# Patient Record
Sex: Female | Born: 1965 | Race: Black or African American | Hispanic: No | Marital: Single | Smoking: Never smoker
Health system: Southern US, Community
[De-identification: ages and names within clinical notes are randomized; demographics above are authoritative.]

## PROBLEM LIST (undated history)

## (undated) DIAGNOSIS — F3130 Bipolar disorder, current episode depressed, mild or moderate severity, unspecified: Secondary | ICD-10-CM

## (undated) DIAGNOSIS — R569 Unspecified convulsions: Secondary | ICD-10-CM

## (undated) DIAGNOSIS — F329 Major depressive disorder, single episode, unspecified: Secondary | ICD-10-CM

## (undated) DIAGNOSIS — F32A Depression, unspecified: Secondary | ICD-10-CM

## (undated) DIAGNOSIS — K649 Unspecified hemorrhoids: Secondary | ICD-10-CM

## (undated) DIAGNOSIS — D649 Anemia, unspecified: Secondary | ICD-10-CM

## (undated) DIAGNOSIS — T7840XA Allergy, unspecified, initial encounter: Secondary | ICD-10-CM

## (undated) DIAGNOSIS — N189 Chronic kidney disease, unspecified: Secondary | ICD-10-CM

## (undated) DIAGNOSIS — K589 Irritable bowel syndrome without diarrhea: Secondary | ICD-10-CM

## (undated) HISTORY — PX: COLONOSCOPY: SHX174

## (undated) HISTORY — PX: WISDOM TOOTH EXTRACTION: SHX21

## (undated) HISTORY — DX: Unspecified hemorrhoids: K64.9

## (undated) HISTORY — DX: Allergy, unspecified, initial encounter: T78.40XA

## (undated) HISTORY — PX: ROOT CANAL: SHX2363

---
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
## 2006-06-05 DIAGNOSIS — R569 Unspecified convulsions: Secondary | ICD-10-CM

## 2006-06-05 HISTORY — DX: Unspecified convulsions: R56.9

## 2013-11-06 ENCOUNTER — Encounter (HOSPITAL_COMMUNITY): Payer: Self-pay | Admitting: Emergency Medicine

## 2013-11-06 ENCOUNTER — Emergency Department (HOSPITAL_COMMUNITY)
Admission: EM | Admit: 2013-11-06 | Discharge: 2013-11-07 | Disposition: A | Discharge status: Other Acute Inpatient Hospital | Payer: BC Managed Care – PPO | Attending: Dermatology | Admitting: Dermatology

## 2013-11-06 DIAGNOSIS — Z79899 Other long term (current) drug therapy: Secondary | ICD-10-CM | POA: Insufficient documentation

## 2013-11-06 DIAGNOSIS — F319 Bipolar disorder, unspecified: Secondary | ICD-10-CM

## 2013-11-06 DIAGNOSIS — R45851 Suicidal ideations: Secondary | ICD-10-CM | POA: Insufficient documentation

## 2013-11-06 DIAGNOSIS — Z8719 Personal history of other diseases of the digestive system: Secondary | ICD-10-CM | POA: Insufficient documentation

## 2013-11-06 DIAGNOSIS — F3130 Bipolar disorder, current episode depressed, mild or moderate severity, unspecified: Secondary | ICD-10-CM | POA: Insufficient documentation

## 2013-11-06 DIAGNOSIS — Z862 Personal history of diseases of the blood and blood-forming organs and certain disorders involving the immune mechanism: Secondary | ICD-10-CM | POA: Insufficient documentation

## 2013-11-06 HISTORY — DX: Major depressive disorder, single episode, unspecified: F32.9

## 2013-11-06 HISTORY — DX: Depression, unspecified: F32.A

## 2013-11-06 HISTORY — DX: Anemia, unspecified: D64.9

## 2013-11-06 HISTORY — DX: Bipolar disorder, current episode depressed, mild or moderate severity, unspecified: F31.30

## 2013-11-06 HISTORY — DX: Irritable bowel syndrome, unspecified: K58.9

## 2013-11-06 LAB — CBC
HEMATOCRIT: 37.2 % (ref 36.0–46.0)
Hemoglobin: 12.1 g/dL (ref 12.0–15.0)
MCH: 26.6 pg (ref 26.0–34.0)
MCHC: 32.5 g/dL (ref 30.0–36.0)
MCV: 81.8 fL (ref 78.0–100.0)
PLATELETS: 286 10*3/uL (ref 150–400)
RBC: 4.55 MIL/uL (ref 3.87–5.11)
RDW: 12.9 % (ref 11.5–15.5)
WBC: 4.8 10*3/uL (ref 4.0–10.5)

## 2013-11-06 LAB — ETHANOL: Alcohol, Ethyl (B): <11 mg/dL (ref 0–11)

## 2013-11-06 LAB — COMPREHENSIVE METABOLIC PANEL
ALT: 10 U/L (ref 0–35)
AST: 15 U/L (ref 0–37)
Albumin: 4.2 g/dL (ref 3.5–5.2)
Alkaline Phosphatase: 116 U/L (ref 39–117)
BILIRUBIN TOTAL: 0.7 mg/dL (ref 0.3–1.2)
BUN: 11 mg/dL (ref 6–23)
CALCIUM: 10.4 mg/dL (ref 8.4–10.5)
CHLORIDE: 107 meq/L (ref 96–112)
CO2: 25 mEq/L (ref 19–32)
CREATININE: 0.75 mg/dL (ref 0.50–1.10)
GFR calc Af Amer: >90 mL/min (ref >90)
GFR calc non Af Amer: >90 mL/min (ref >90)
Glucose, Bld: 100 mg/dL — ABNORMAL HIGH (ref 70–99)
Potassium: 3.5 mEq/L — ABNORMAL LOW (ref 3.7–5.3)
Sodium: 144 mEq/L (ref 137–147)
Total Protein: 8.3 g/dL (ref 6.0–8.3)

## 2013-11-06 LAB — RAPID URINE DRUG SCREEN, HOSP PERFORMED
Amphetamines: NOT DETECTED
BARBITURATES: NOT DETECTED
BENZODIAZEPINES: NOT DETECTED
COCAINE: NOT DETECTED
OPIATES: NOT DETECTED
Tetrahydrocannabinol: NOT DETECTED

## 2013-11-06 LAB — LITHIUM LEVEL: Lithium Lvl: <0.25 mEq/L — ABNORMAL LOW (ref 0.80–1.40)

## 2013-11-06 LAB — ACETAMINOPHEN LEVEL

## 2013-11-06 LAB — SALICYLATE LEVEL: Salicylate Lvl: <2 mg/dL — ABNORMAL LOW (ref 2.8–20.0)

## 2013-11-06 MED ORDER — LAMOTRIGINE 200 MG PO TABS
200.0000 mg | ORAL_TABLET | Freq: Every day | ORAL | Status: DC
  Administered 2013-11-06: 200 mg via ORAL
  Filled 2013-11-06 (×2): qty 1

## 2013-11-06 MED ORDER — TRAZODONE HCL 50 MG PO TABS
50.0000 mg | ORAL_TABLET | Freq: Every day | ORAL | Status: DC
  Administered 2013-11-06: 50 mg via ORAL
  Filled 2013-11-06: qty 1

## 2013-11-06 MED ORDER — LITHIUM CARBONATE 300 MG PO CAPS
450.0000 mg | ORAL_CAPSULE | Freq: Two times a day (BID) | ORAL | Status: DC
  Administered 2013-11-06 – 2013-11-07 (×2): 450 mg via ORAL
  Filled 2013-11-06 (×4): qty 1

## 2013-11-06 MED ORDER — LITHIUM CARBONATE 300 MG PO CAPS: 900.0000 mg | ORAL_CAPSULE | Freq: Every day | ORAL | Status: DC

## 2013-11-06 MED ORDER — IBUPROFEN 200 MG PO TABS: 600.0000 mg | ORAL_TABLET | Freq: Three times a day (TID) | ORAL | Status: DC | PRN

## 2013-11-06 MED ORDER — VENLAFAXINE HCL ER 150 MG PO CP24
150.0000 mg | ORAL_CAPSULE | Freq: Every day | ORAL | Status: DC
  Administered 2013-11-07: 150 mg via ORAL
  Filled 2013-11-06 (×2): qty 1

## 2013-11-06 MED ORDER — ACETAMINOPHEN 325 MG PO TABS
650.0000 mg | ORAL_TABLET | ORAL | Status: DC | PRN
  Administered 2013-11-06 – 2013-11-07 (×2): 650 mg via ORAL
  Filled 2013-11-06 (×2): qty 2

## 2013-11-06 NOTE — Progress Notes (Signed)
  CARE MANAGEMENT ED NOTE 11/06/2013  Patient:  Carmen Kim, Carmen Kim   Account Number:  [id number]  Date Initiated:  11/06/2013  Documentation initiated by:  Jackelyn Poling  Subjective/Objective Assessment:   48 yr old  [facility] [place] ppo covered pt reports she is homeless in [place] with only a friend listed as emergency contact c/o mental crisis bipolar, thoughts of hurting herself, PMH bipolar affect, depressed     Subjective/Objective Assessment Detail:   Pt confirms she has no pcp Has [employer] [facility] [place] ppo insurance coverage and has only seen a Tour manager Reports " I do have some medical problems that need to be taken care of also."  Pt agreeable to a list of in network [facility] [place] ppo providers  Very appreciative of resources offered     Action/Plan:   ED Cm spoke with pt m discussed f/u care with a pcp Discussed how to obtain further providers as needed via website or customer services Provided with a list of [facility] pcps & psychiatrists within zip [phone]   Action/Plan Detail:   Encouraged f/u   Anticipated DC Date:  11/06/2013     Status Recommendation to Physician:   Result of Recommendation:    Other ED [place]  Other  PCP issues    Choice offered to / List presented to:            Status of service:  Completed, signed off  ED Comments:   ED Comments Detail:

## 2013-11-06 NOTE — Progress Notes (Signed)
CSW received a call from [place] with [facility] accepting this patient per Dr. Abbey Chatters. Patient can arrive anytime after 9am 11/07/2013.  Report# [id number].  CSW informed the nursing staff of the update in disposition.      Chesley Noon, MSW, [place], 11/06/2013 Evening Clinical Social Worker [phone]

## 2013-11-06 NOTE — BH Assessment (Signed)
Assessment Note  Carmen Kim is an 48 y.o. female. Patient was brought into the ED by school faculty because of suicidal ideation with plan to overdose.  Patient reports that she is currently unable to contract for safety.  Patient reports last week taking 15 trazodone and Lamictal attempting to overdose and tying a plastic bag over her head then falling asleep to suffocate. Patient reports the result of her the previously mention attempt was that she awoke with the bag torn.  Patient reports 3 other suicide attempts in the past but the last was 10 years ago.  She reports a family history of mental health diagnosis from schizophrenia,  bipolar disorder and substance abuse.  Patient denies homicidal ideations, hallucination, other self injurious behaviors, and substance abuse.   Patient reports current depression symptoms includes hopelessness, worthlessness, overwhelmed, increased anxiety, irritability, increased sleep, and poor eating habits.    CSW ran patient with Manus Gunning, NP and Dr. Ashok Cordia it is recommended refer for inpatient treatment for safety and stabilization. CSW spoke with Randall Hiss, [facility] as he reports no 500 hall beds at this time.  The patient will need to be referred to outside facilities.    Axis I: Bipolar, Depressed Axis II: Deferred Axis III:  Past Medical History  Diagnosis Date  . Bipolar affect, depressed   . Depressed   . IBS (irritable bowel syndrome)   . Anemia    Axis IV: economic problems, educational problems, housing problems, occupational problems, other psychosocial or environmental problems, problems related to social environment, problems with access to health care services and problems with primary support group Axis V: 11-20 some danger of hurting self or others possible OR occasionally fails to maintain minimal personal hygiene OR gross impairment in communication  Past Medical History:  Past Medical History  Diagnosis Date  . Bipolar affect, depressed   .  Depressed   . IBS (irritable bowel syndrome)   . Anemia     History reviewed. No pertinent past surgical history.  Family History: History reviewed. No pertinent family history.  Social History:  reports that she has never smoked. She does not have any smokeless tobacco history on file. She reports that she does not drink alcohol or use illicit drugs.  Additional Social History:     CIWA: CIWA-Ar BP: 137/90 mmHg Pulse Rate: 69 COWS:    Allergies:  Allergies  Allergen Reactions  . Wellbutrin [Bupropion] Other (See Comments)    seizures    Home Medications:  (Not in a hospital admission)  OB/GYN Status:  Patient's last menstrual period was 09/06/2013.  General Assessment Data Location of Assessment: [facility] ACT Assessment: Yes Is this a Tele or Face-to-Face Assessment?: Face-to-Face Is this an Initial Assessment or a Re-assessment for this encounter?: Initial Assessment Living Arrangements: Alone Can pt return to current living arrangement?: Yes Admission Status: Voluntary Is patient capable of signing voluntary admission?: Yes Transfer from: Home Referral Source: Self/Family/Friend  Medical Screening Exam ([place]) Medical Exam completed: Yes  [place] Living Arrangements: Alone Name of Psychiatrist: [facility] Name of Therapist: [employer]  Education Status Is patient currently in school?: Yes Current Grade: Graduate School Highest grade of school patient has completed: Undergrad Name of school: [employer]  Risk to self Suicidal Ideation: Yes-Currently Present Suicidal Intent: Yes-Currently Present Is patient at risk for suicide?: Yes Suicidal Plan?: Yes-Currently Present Specify Current Suicidal Plan: overdose Access to Means: Yes Specify Access to Suicidal Means: personal medication What has been your use of drugs/alcohol  within the last 12 months?: none Previous Attempts/Gestures: Yes How many times?: 4 Other Self Harm Risks: none Triggers for  Past Attempts: Unpredictable;Other (Comment) (financial instability) Intentional Self Injurious Behavior: None Family Suicide History: No Recent stressful life event(s): Loss (Comment);Financial Problems Persecutory voices/beliefs?: No Depression: Yes Depression Symptoms: Despondent;Tearfulness;Isolating;Guilt;Loss of interest in usual pleasures;Feeling worthless/self pity;Feeling angry/irritable Substance abuse history and/or treatment for substance abuse?: No  Risk to Others Homicidal Ideation: No-Not Currently/Within Last 6 Months Thoughts of Harm to Others: No-Not Currently Present/Within Last 6 Months Current Homicidal Intent: No-Not Currently/Within Last 6 Months Current Homicidal Plan: No-Not Currently/Within Last 6 Months Access to Homicidal Means: No Identified Victim: noen History of harm to others?: No Assessment of Violence: None Noted Does patient have access to weapons?: No Criminal Charges Pending?: No Does patient have a court date: No  Psychosis Hallucinations: None noted Delusions: None noted  Mental Status Report Appear/Hygiene: In hospital gown Eye Contact: Good Motor Activity: Freedom of movement Speech: Pressured;Soft;Logical/coherent Level of Consciousness: Alert Mood: Depressed;Guilty;Anhedonia;Irritable;Worthless, low self-esteem;Sad (hopelessness) Affect: Depressed;Irritable;Sad;Flat Anxiety Level: Moderate Thought Processes: Coherent Judgement: Unimpaired Orientation: Person;Place;Time;Appropriate for developmental age;Situation Obsessive Compulsive Thoughts/Behaviors: None  Cognitive Functioning Concentration: Decreased Memory: Remote Intact;Recent Intact IQ: Average Insight: Fair Impulse Control: Poor Appetite: Fair Sleep: Increased Total Hours of Sleep: 16 Vegetative Symptoms: None  ADLScreening [facility] Assessment Services) Patient's cognitive ability adequate to safely complete daily activities?: Yes Patient able to express need for  assistance with ADLs?: Yes Independently performs ADLs?: Yes (appropriate for developmental age)  Prior Inpatient Therapy Prior Inpatient Therapy: Yes Prior Therapy Dates: 1989 Prior Therapy Facilty/Provider(s): [facility] Reason for Treatment: SI  Prior Outpatient Therapy Prior Outpatient Therapy: Yes Prior Therapy Dates: 2015 Prior Therapy Facilty/Provider(s): [employer] Reason for Treatment: Bipolar 2  ADL Screening (condition at time of admission) Patient's cognitive ability adequate to safely complete daily activities?: Yes Patient able to express need for assistance with ADLs?: Yes Independently performs ADLs?: Yes (appropriate for developmental age)         Values / Beliefs Cultural Requests During Hospitalization: None Spiritual Requests During Hospitalization: None        Additional Information 1:1 In Past 12 Months?: No CIRT Risk: No Elopement Risk: No Does patient have medical clearance?: Yes     Disposition:  Disposition Initial Assessment Completed for this Encounter: Yes Disposition of Patient: Inpatient treatment program Type of inpatient treatment program: Adult  On Site Evaluation by:   Reviewed with Physician:    Edmund Hilda 11/06/2013 7:37 PM

## 2013-11-06 NOTE — Progress Notes (Signed)
Attempted placement at the following facilities:  [facility]- left message [facility]- no beds [facility] only on geri beds [facility]- only geri female beds but faxed information for potential discharges    Chesley Noon, MSW, [place], 11/06/2013 Evening Clinical Social Worker [phone]

## 2013-11-06 NOTE — ED Notes (Signed)
Patient endorses SI. Denies HI, AVH. Contracts for safety on the unit. Rates current feelings of anxiety 8/10, depression 10/10. Reports headache. Patient is calm, cooperative.  Encouragement offered. Given Tylenol. Patient able to give urine sample.  Patient safety maintained, Q 15 checks continue.

## 2013-11-06 NOTE — ED Notes (Signed)
Pt has hx of bipolar.  Sees psychiatrist for med consults at [facility].  Pt takes trazadone and lamictal.  States she is not doing well.  Pt is having thoughts of hurting self.  Attempted last week to take extra meds and  to use plastic bag over her head.  Pt woke up with bag off her head.  Pt states new plan would be to hang herself.  Pt lives alone.  Recently had multiple moves d/t financial changes.  No resources or assistance close by.  Pt plans to move out of state were changed.  Pt moved out of dorm to extended stay.  Money got too tight and she moved in to a cheaper motel.  Has been pawning belongings to get by.  At the end of the rope in that she has no money and no place to go.  Plan formed to kill herself today during last night available at hotel.

## 2013-11-06 NOTE — ED Provider Notes (Signed)
CSN: [id number]     Arrival date & time 11/06/13  1424 History   First MD Initiated Contact with Patient 11/06/13 1503     Chief Complaint  Patient presents with  . Medical Clearance     (Consider location/radiation/quality/duration/timing/severity/associated sxs/prior Treatment) The history is provided by the patient.  pt with hx bipolar disorder c/o feeling very depressed in past 2 weeks. Is graduate student at [employer], states several stressors recently including incomplete assignments, problems w student aid, summer internship falling through, financial challenges.  Pt states also off her meds for past few weeks, states stopped taking as they didn't help anyway. Trouble sleeping at night, decreased appetite. +suicidal thoughts w plan.  Denies attempt to harm self, denies od. States physical health at baseline. Denies any problems w etoh or substance abuse.      Past Medical History  Diagnosis Date  . Bipolar affect, depressed   . Depressed   . IBS (irritable bowel syndrome)   . Anemia    History reviewed. No pertinent past surgical history. History reviewed. No pertinent family history. History  Substance Use Topics  . Smoking status: Never Smoker   . Smokeless tobacco: Not on file  . Alcohol Use: No   OB History   Grav Para Term Preterm Abortions TAB SAB Ect Mult Living                 Review of Systems  Constitutional: Negative for fever and chills.  HENT: Negative for sore throat.   Eyes: Negative for redness.  Respiratory: Negative for shortness of breath.   Cardiovascular: Negative for chest pain.  Gastrointestinal: Negative for vomiting and abdominal pain.  Genitourinary: Negative for flank pain.  Musculoskeletal: Negative for back pain and neck pain.  Skin: Negative for rash.  Neurological: Negative for headaches.  Hematological: Does not bruise/bleed easily.  Psychiatric/Behavioral: Positive for suicidal ideas and dysphoric mood.      Allergies   Wellbutrin  Home Medications   Prior to Admission medications   Medication Sig Start Date End Date Taking? Authorizing Provider  dicyclomine (BENTYL) 10 MG capsule Take 10 mg by mouth 4 (four) times daily as needed for spasms.   Yes Historical Provider, MD  lamoTRIgine (LAMICTAL) 100 MG tablet Take 200 mg by mouth daily.   Yes Historical Provider, MD  lithium carbonate 300 MG capsule Take 900 mg by mouth at bedtime.   Yes Historical Provider, MD  traZODone (DESYREL) 50 MG tablet Take 50 mg by mouth at bedtime.   Yes Historical Provider, MD  venlafaxine XR (EFFEXOR-XR) 150 MG 24 hr capsule Take 150 mg by mouth daily with breakfast.   Yes Historical Provider, MD   BP 123/74  Pulse 81  Temp(Src) 98.2 F (36.8 C) (Oral)  Resp 20  SpO2 100%  LMP 09/06/2013 Physical Exam  Nursing note and vitals reviewed. Constitutional: She is oriented to person, place, and time. She appears well-developed and well-nourished. No distress.  HENT:  Head: Atraumatic.  Eyes: Conjunctivae are normal. No scleral icterus.  Neck: Neck supple. No tracheal deviation present.  Cardiovascular: Normal rate, regular rhythm, normal heart sounds and intact distal pulses.   Pulmonary/Chest: Effort normal and breath sounds normal. No respiratory distress.  Abdominal: Soft. Normal appearance and bowel sounds are normal. She exhibits no distension. There is no tenderness.  Musculoskeletal: She exhibits no edema and no tenderness.  Neurological: She is alert and oriented to person, place, and time.  Skin: Skin is warm and dry. No rash  noted.  Psychiatric:  Depressed mood, +SI.     ED Course  Procedures (including critical care time) Labs Review Results for orders placed during the hospital encounter of 11/06/13  CBC      Result Value Ref Range   WBC 4.8  4.0 - 10.5 K/uL   RBC 4.55  3.87 - 5.11 MIL/uL   Hemoglobin 12.1  12.0 - 15.0 g/dL   HCT 37.2  36.0 - 46.0 %   MCV 81.8  78.0 - 100.0 fL   MCH 26.6  26.0 -  34.0 pg   MCHC 32.5  30.0 - 36.0 g/dL   RDW 12.9  11.5 - 15.5 %   Platelets 286  150 - 400 K/uL  COMPREHENSIVE METABOLIC PANEL      Result Value Ref Range   Sodium 144  137 - 147 mEq/L   Potassium 3.5 (*) 3.7 - 5.3 mEq/L   Chloride 107  96 - 112 mEq/L   CO2 25  19 - 32 mEq/L   Glucose, Bld 100 (*) 70 - 99 mg/dL   BUN 11  6 - 23 mg/dL   Creatinine, Ser 0.75  0.50 - 1.10 mg/dL   Calcium 10.4  8.4 - 10.5 mg/dL   Total Protein 8.3  6.0 - 8.3 g/dL   Albumin 4.2  3.5 - 5.2 g/dL   AST 15  0 - 37 U/L   ALT 10  0 - 35 U/L   Alkaline Phosphatase 116  39 - 117 U/L   Total Bilirubin 0.7  0.3 - 1.2 mg/dL   GFR calc non Af Amer >90  >90 mL/min   GFR calc Af Amer >90  >90 mL/min  URINE RAPID DRUG SCREEN (HOSP PERFORMED)      Result Value Ref Range   Opiates NONE DETECTED  NONE DETECTED   Cocaine NONE DETECTED  NONE DETECTED   Benzodiazepines NONE DETECTED  NONE DETECTED   Amphetamines NONE DETECTED  NONE DETECTED   Tetrahydrocannabinol NONE DETECTED  NONE DETECTED   Barbiturates NONE DETECTED  NONE DETECTED  ETHANOL      Result Value Ref Range   Alcohol, Ethyl (B) <11  0 - 11 mg/dL  LITHIUM LEVEL      Result Value Ref Range   Lithium Lvl <0.25 (*) 0.80 - 1.40 mEq/L  ACETAMINOPHEN LEVEL      Result Value Ref Range   Acetaminophen (Tylenol), Serum <15.0  10 - 30 ug/mL  SALICYLATE LEVEL      Result Value Ref Range   Salicylate Lvl <9.3 (*) 2.8 - 20.0 mg/dL       MDM  Labs.  Psych team consulted.  Reviewed nursing notes and prior charts for additional history.   Disposition per psych team - pt to require inpatient psych treatment.      Mirna Mires, MD 11/08/13 [phone]

## 2014-09-17 ENCOUNTER — Encounter (HOSPITAL_COMMUNITY): Payer: Self-pay | Admitting: Emergency Medicine

## 2014-09-17 ENCOUNTER — Emergency Department (INDEPENDENT_AMBULATORY_CARE_PROVIDER_SITE_OTHER)
Admission: EM | Admit: 2014-09-17 | Discharge: 2014-09-17 | Disposition: A | Discharge status: Home / Self Care | Payer: Self-pay | Source: Home / Self Care | Attending: Family Medicine | Admitting: Family Medicine

## 2014-09-17 DIAGNOSIS — R21 Rash and other nonspecific skin eruption: Secondary | ICD-10-CM

## 2014-09-17 MED ORDER — PREDNISONE 10 MG (48) PO TBPK: ORAL_TABLET | Freq: Every day | ORAL | Status: DC

## 2014-09-17 MED ORDER — FLUCONAZOLE 150 MG PO TABS: 150.0000 mg | ORAL_TABLET | Freq: Every day | ORAL | Status: DC

## 2014-09-17 MED ORDER — CEPHALEXIN 500 MG PO CAPS: 500.0000 mg | ORAL_CAPSULE | Freq: Two times a day (BID) | ORAL | Status: DC

## 2014-09-17 NOTE — ED Notes (Signed)
Reports red blistery rash on right lower arm below wrist. On set 4/13.  Pt has tried apple sider and warm compresses with no relief.

## 2014-09-17 NOTE — Discharge Instructions (Signed)
Your rash appears to be contact dermatitis but there may be some underlying bacterial infection causing cellulitis. Please start with the steroids and take them for the full duration. Please start the antibiotics if the rash becomes more red and painful. Please use the Diflucan if you develop a yeast infection.

## 2014-09-17 NOTE — ED Provider Notes (Signed)
CSN: [id number]     Arrival date & time 09/17/14  1846 History   First MD Initiated Contact with Patient 09/17/14 1949     Chief Complaint  Patient presents with  . Rash   (Consider location/radiation/quality/duration/timing/severity/associated sxs/prior Treatment) HPI  Rash: started 1 day ago. Constant. Getting bigger. Itchy. Apple cyder vinegar and warm compress w/o benefit. Denies fevers, nausea, vomiting,, pain, headache, dysuria, frequency, back pain. No known trauma, denies any new soaps lotions detergents. Denies any tenderness on palpation. Denies any purulence.  Past Medical History  Diagnosis Date  . Bipolar affect, depressed   . Depressed   . IBS (irritable bowel syndrome)   . Anemia    History reviewed. No pertinent past surgical history. Family History  Problem Relation Age of Onset  . Diabetes Mother   . Hypertension Mother   . Cerebral aneurysm Mother    History  Substance Use Topics  . Smoking status: Never Smoker   . Smokeless tobacco: Not on file  . Alcohol Use: No   OB History    No data available     Review of Systems Per HPI with all other pertinent systems negative.   Allergies  Wellbutrin  Home Medications   Prior to Admission medications   Medication Sig Start Date End Date Taking? Authorizing Provider  cephALEXin (KEFLEX) 500 MG capsule Take 1 capsule (500 mg total) by mouth 2 (two) times daily. 09/17/14   Waldemar Dickens, MD  dicyclomine (BENTYL) 10 MG capsule Take 10 mg by mouth 4 (four) times daily as needed for spasms.    Historical Provider, MD  fluconazole (DIFLUCAN) 150 MG tablet Take 1 tablet (150 mg total) by mouth daily. Repeat dose in 3 days 09/17/14   Waldemar Dickens, MD  lamoTRIgine (LAMICTAL) 100 MG tablet Take 200 mg by mouth daily.    Historical Provider, MD  lithium carbonate 300 MG capsule Take 900 mg by mouth at bedtime.    Historical Provider, MD  predniSONE (STERAPRED UNI-PAK 48 TAB) 10 MG (48) TBPK tablet Take by mouth  daily. 09/17/14   Waldemar Dickens, MD  traZODone (DESYREL) 50 MG tablet Take 50 mg by mouth at bedtime.    Historical Provider, MD  venlafaxine XR (EFFEXOR-XR) 150 MG 24 hr capsule Take 150 mg by mouth daily with breakfast.    Historical Provider, MD   BP 125/90 mmHg  Pulse 80  Temp(Src) 98.1 F (36.7 C) (Oral)  SpO2 98% Physical Exam Physical Exam  Constitutional: oriented to person, place, and time. appears well-developed and well-nourished. No distress.  HENT:  Head: Normocephalic and atraumatic.  Eyes: EOMI. PERRL.  Neck: Normal range of motion.  Cardiovascular: RRR, no m/r/g, 2+ distal pulses,  Pulmonary/Chest: Effort normal and breath sounds normal. No respiratory distress.  Abdominal: Soft. Bowel sounds are normal. NonTTP, no distension.  Musculoskeletal: Normal range of motion. Non ttp, no effusion.  Neurological: alert and oriented to person, place, and time.  Skin: Right distal forearm with several erythematous macular lesions that are somewhat indurated, nontender to palpation, Psychiatric: normal mood and affect. behavior is normal. Judgment and thought content normal.   ED Course  Procedures (including critical care time) Labs Review Labs Reviewed - No data to display  Imaging Review No results found.   MDM   1. Rash    Etiology not immediately clear but concerning for possible contact dermatitis versus other allergic reaction versus autoimmune. Concern for possible beginnings of secondary bacterial infection causing cellulitis. Patient start  steroid Dosepak and will start Keflex if needed. Diflucan if develops [place] infection. Follow-up with PCP if rash returns or develops another areas for autoimmune workup.    Waldemar Dickens, MD 09/17/14 2018

## 2017-01-23 NOTE — Progress Notes (Addendum)
[place] at [facility] [address] [place], [place] [address] [phone]  Date:  01/25/2017   Name:  Carmen Kim   DOB:  24-Nov-1965   MRN:  [id number]  PCP:  Darreld Mclean, MD    Chief Complaint: Establish Care (Pt states she's been out of care for a few years. Wants to discuss getting labs, behavioral health and Rx managament as well as getting a mammo and AWV )   History of Present Illness:  Ileana Kim is a 51 y.o. 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.  In 2015 she had a "mental health breakdown," and was even homeless for a short period of time. Since then she has gotten back on her feet and is doing much better She just recently started seeing a therapist.    She is not on any medications right now but would like to start back on her meds for bipolar disorder She does not have a car- but has to rely on Lyft a lot of the time   She has been dx with bipolar disorder, does not have a psychiatrist right now  Her sleep is not that great - she is able to fall asleep, but will wake up easily a lot of the time She does snore, and is interested in getting a sleep study to eval for OSA  She is not fasting- she had some chicken and broccoli several hours ago.  She has been menopausal for about 3 years now   She has not history of CAD.  She is able to walk a long distance wihtout any CP or SOB  Will plan to taper her back on Lamictal and lithium She had been on  lithium ER 900 in the everning and lamictal 200 daily most recently and felt like she did well with this She does not want to go back on trazodone right now, and we will also not start her back on effexor   Right now she feels like her irritabilty is high, and she has some depression symptoms although not severe  No SI at this time  There are no active problems to display for this patient.   Past Medical History:  Diagnosis Date  . Anemia   . Bipolar affect, depressed ([place])   . Depressed   . IBS (irritable bowel syndrome)     No past surgical history on file.  Social History  Substance Use Topics  . Smoking status: Never Smoker  . Smokeless tobacco: Never Used  . Alcohol use 0.6 oz/week    1 Glasses of wine per week    Family History  Problem Relation Age of Onset  . Diabetes Mother   . Hypertension Mother   . Cerebral aneurysm Mother     Allergies  Allergen Reactions  . Wellbutrin [Bupropion] Other (See Comments)    seizures    Medication list has been reviewed and updated.  Current Outpatient  Prescriptions on File Prior to Visit  Medication Sig Dispense Refill  . dicyclomine (BENTYL) 10 MG capsule Take 10 mg by mouth 4 (four) times daily as needed for spasms.    Marland Kitchen lamoTRIgine (LAMICTAL) 100 MG tablet Take 200 mg by mouth daily.    Marland Kitchen lithium carbonate 300 MG capsule Take 900 mg by mouth at bedtime.    . traZODone (DESYREL) 50 MG tablet Take 50 mg by mouth at bedtime.    Marland Kitchen venlafaxine XR (EFFEXOR-XR) 150 MG 24 hr capsule Take 150 mg by mouth daily with breakfast.     No current facility-administered medications on file prior to visit.     Review of Systems:  As per HPI- otherwise negative.   Physical Examination: Vitals:   01/25/17 0834  BP: 126/88  Pulse: 66  Resp: 20  Temp: 98.3 F (36.8 C)  SpO2: 99%   Vitals:   01/25/17 0834  Weight: 213 lb (96.6 kg)  Height: 5\' 4"  (1.626 m)   Body mass index is 36.56 kg/m. Ideal Body Weight: Weight in  (lb) to have BMI = 25: 145.3  GEN: WDWN, NAD, Non-toxic, A & O x 3 HEENT: Atraumatic, Normocephalic. Neck supple. No masses, No LAD. Ears and Nose: No external deformity. CV: RRR, No M/G/R. No JVD. No thrill. No extra heart sounds. PULM: CTA B, no wheezes, crackles, rhonchi. No retractions. No resp. distress. No accessory muscle use. ABD: S, NT, ND, +BS. No rebound. No HSM. EXTR: No c/c/e NEURO Normal gait.  PSYCH: Normally interactive. Conversant. Not depressed or anxious appearing.  Calm demeanor.   EKG: SR.  She does have downgoing T wave in III only.  No acute ST changes. No old EKG for comparison Assessment and Plan: Medication monitoring encounter - Plan: Comprehensive metabolic panel, EKG 93-TDSK, POCT urinalysis dipstick  Screening for hyperlipidemia - Plan: Lipid panel  Screening for deficiency anemia - Plan: CBC  Vitamin D deficiency - Plan: Vitamin D (25 hydroxy)  Chronic fatigue - Plan: Ambulatory referral to Neurology, TSH, Vitamin D (25 hydroxy)  Snoring - Plan: Ambulatory referral to Neurology  Screening for diabetes mellitus - Plan: Lipid panel, Hemoglobin A1c  Bipolar 2 disorder ([place]) - Plan: lamoTRIgine (LAMICTAL) 25 MG CHEW chewable tablet, lithium carbonate (LITHOBID) 300 MG CR tablet  Here today to establish care Labs pending as above She would like a sleep study- will set up for her EKG today is overall reassuring - she has no history of CAD or CP It will be challenging to find her a psychiatrist, so I will start getting back on her meds for bipolar disorder while she works on finding a psychiatrist. Assuming her labs are ok will plan to have her titrate back on lamictal and lithium with close monitoring planned.  I did rx these meds but she will not pick them up until we speak in case of any problems on her labs  Signed Lamar Blinks, MD  8/24 Called pt and [facility]- her labs look good so far.  Will call her to discuss her medication further

## 2017-01-25 ENCOUNTER — Ambulatory Visit (INDEPENDENT_AMBULATORY_CARE_PROVIDER_SITE_OTHER): Payer: Managed Care, Other (non HMO) | Admitting: Family Medicine

## 2017-01-25 ENCOUNTER — Encounter: Payer: Self-pay | Admitting: Family Medicine

## 2017-01-25 ENCOUNTER — Telehealth: Payer: Self-pay | Admitting: Family Medicine

## 2017-01-25 ENCOUNTER — Other Ambulatory Visit: Payer: Self-pay | Admitting: Family Medicine

## 2017-01-25 VITALS — BP 126/88 | HR 66 | Temp 98.3°F | Resp 20 | Ht 64.0 in | Wt 213.0 lb

## 2017-01-25 DIAGNOSIS — Z13220 Encounter for screening for lipoid disorders: Secondary | ICD-10-CM | POA: Diagnosis not present

## 2017-01-25 DIAGNOSIS — Z130 Encounter for screening for diseases of the blood and blood-forming organs and certain disorders involving the immune mechanism: Secondary | ICD-10-CM

## 2017-01-25 DIAGNOSIS — Z131 Encounter for screening for diabetes mellitus: Secondary | ICD-10-CM | POA: Diagnosis not present

## 2017-01-25 DIAGNOSIS — R5382 Chronic fatigue, unspecified: Secondary | ICD-10-CM | POA: Insufficient documentation

## 2017-01-25 DIAGNOSIS — Z5181 Encounter for therapeutic drug level monitoring: Secondary | ICD-10-CM | POA: Diagnosis not present

## 2017-01-25 DIAGNOSIS — F3181 Bipolar II disorder: Secondary | ICD-10-CM | POA: Diagnosis not present

## 2017-01-25 DIAGNOSIS — R0683 Snoring: Secondary | ICD-10-CM | POA: Diagnosis not present

## 2017-01-25 DIAGNOSIS — E559 Vitamin D deficiency, unspecified: Secondary | ICD-10-CM | POA: Insufficient documentation

## 2017-01-25 DIAGNOSIS — Z1239 Encounter for other screening for malignant neoplasm of breast: Secondary | ICD-10-CM

## 2017-01-25 LAB — COMPREHENSIVE METABOLIC PANEL
ALT: 28 U/L (ref 0–35)
AST: 25 U/L (ref 0–37)
Albumin: 4.1 g/dL (ref 3.5–5.2)
Alkaline Phosphatase: 106 U/L (ref 39–117)
BUN: 21 mg/dL (ref 6–23)
CHLORIDE: 105 meq/L (ref 96–112)
CO2: 30 mEq/L (ref 19–32)
CREATININE: 0.81 mg/dL (ref 0.40–1.20)
Calcium: 9.9 mg/dL (ref 8.4–10.5)
GFR: 95.63 mL/min (ref >60.00)
Glucose, Bld: 89 mg/dL (ref 70–99)
Potassium: 4.6 mEq/L (ref 3.5–5.1)
SODIUM: 139 meq/L (ref 135–145)
Total Bilirubin: 0.5 mg/dL (ref 0.2–1.2)
Total Protein: 8 g/dL (ref 6.0–8.3)

## 2017-01-25 LAB — POCT URINALYSIS DIP (MANUAL ENTRY)
BILIRUBIN UA: NEGATIVE
GLUCOSE UA: NEGATIVE mg/dL
Ketones, POC UA: NEGATIVE mg/dL
Leukocytes, UA: NEGATIVE
Nitrite, UA: NEGATIVE
Protein Ur, POC: NEGATIVE mg/dL
RBC UA: NEGATIVE
Spec Grav, UA: 1.02 (ref 1.010–1.025)
UROBILINOGEN UA: 0.2 U/dL
pH, UA: 6 (ref 5.0–8.0)

## 2017-01-25 LAB — CBC
HCT: 36.4 % (ref 36.0–46.0)
Hemoglobin: 11.6 g/dL — ABNORMAL LOW (ref 12.0–15.0)
MCHC: 31.9 g/dL (ref 30.0–36.0)
MCV: 82.2 fl (ref 78.0–100.0)
Platelets: 243 10*3/uL (ref 150.0–400.0)
RBC: 4.44 Mil/uL (ref 3.87–5.11)
RDW: 14.6 % (ref 11.5–15.5)
WBC: 5.1 10*3/uL (ref 4.0–10.5)

## 2017-01-25 LAB — LIPID PANEL
Cholesterol: 171 mg/dL (ref 0–200)
HDL: 68.2 mg/dL (ref >39.00)
LDL Cholesterol: 90 mg/dL (ref 0–99)
NonHDL: 103.09
Total CHOL/HDL Ratio: 3
Triglycerides: 67 mg/dL (ref 0.0–149.0)
VLDL: 13.4 mg/dL (ref 0.0–40.0)

## 2017-01-25 LAB — TSH: TSH: 1.26 u[IU]/mL (ref 0.35–4.50)

## 2017-01-25 LAB — HEMOGLOBIN A1C: HEMOGLOBIN A1C: 5.4 % (ref 4.6–6.5)

## 2017-01-25 LAB — VITAMIN D 25 HYDROXY (VIT D DEFICIENCY, FRACTURES): VITD: 14.66 ng/mL — ABNORMAL LOW (ref 30.00–100.00)

## 2017-01-25 MED ORDER — LITHIUM CARBONATE ER 300 MG PO TBCR: 300.0000 mg | EXTENDED_RELEASE_TABLET | Freq: Every day | ORAL | 1 refills | Status: DC

## 2017-01-25 MED ORDER — LAMOTRIGINE 25 MG PO CHEW: CHEWABLE_TABLET | ORAL | 0 refills | Status: DC

## 2017-01-25 NOTE — Patient Instructions (Addendum)
It was a pleasure to meet you today Take care and I will be in touch with your labs asap We will set you up to see neurology for a sleep study   For psychiatry-  Dr. Toy Care may be a good option as far as location [address] [place], [place]   Also please feel free to look at psychiatry options online; please schedule to see a psychiatrist as soon as you are able.  I am glad to get your started back on treatment for your bipolar disorder but would like to get a psychiatrist to help you  We are glad to do your pap and mammogram here at your convenience   Please call [employer] and see if they cover the Cologuard test- this would be an easier way of testing for colon cancer

## 2017-01-25 NOTE — Telephone Encounter (Signed)
Spoke with Suezanne Jacquet at [facility]. He wanted to verify that lithium 300mg  rx and lamictal 25mg  rx were ok to dispense as they were sent. Per verbal from PCP, lamictal 25mg  dosing is correct and it is ok to dispense other form (capsule / tablet) if chewable is not available.

## 2017-01-25 NOTE — Telephone Encounter (Signed)
[place] call in with questions about prescriptions for pt     Lesly Rubenstein - [phone]

## 2017-01-30 ENCOUNTER — Encounter: Payer: Self-pay | Admitting: Family Medicine

## 2017-01-30 DIAGNOSIS — E559 Vitamin D deficiency, unspecified: Secondary | ICD-10-CM

## 2017-01-31 MED ORDER — VITAMIN D (ERGOCALCIFEROL) 1.25 MG (50000 UNIT) PO CAPS: 50000.0000 [IU] | ORAL_CAPSULE | ORAL | 0 refills | Status: DC

## 2017-02-27 ENCOUNTER — Encounter: Payer: Self-pay | Admitting: Neurology

## 2017-02-27 ENCOUNTER — Ambulatory Visit (INDEPENDENT_AMBULATORY_CARE_PROVIDER_SITE_OTHER): Payer: Managed Care, Other (non HMO) | Admitting: Neurology

## 2017-02-27 VITALS — BP 145/102 | HR 82 | Ht 64.0 in | Wt 217.0 lb

## 2017-02-27 DIAGNOSIS — G4719 Other hypersomnia: Secondary | ICD-10-CM | POA: Diagnosis not present

## 2017-02-27 DIAGNOSIS — G478 Other sleep disorders: Secondary | ICD-10-CM | POA: Diagnosis not present

## 2017-02-27 DIAGNOSIS — R51 Headache: Secondary | ICD-10-CM

## 2017-02-27 DIAGNOSIS — R0683 Snoring: Secondary | ICD-10-CM | POA: Diagnosis not present

## 2017-02-27 DIAGNOSIS — R351 Nocturia: Secondary | ICD-10-CM

## 2017-02-27 DIAGNOSIS — R519 Headache, unspecified: Secondary | ICD-10-CM

## 2017-02-27 DIAGNOSIS — E669 Obesity, unspecified: Secondary | ICD-10-CM | POA: Diagnosis not present

## 2017-02-27 DIAGNOSIS — F319 Bipolar disorder, unspecified: Secondary | ICD-10-CM | POA: Diagnosis not present

## 2017-02-27 NOTE — Patient Instructions (Signed)

## 2017-02-27 NOTE — Progress Notes (Signed)
Subjective:    Patient ID: Carmen Kim is a 51 y.o. female.  HPI     Star Age, MD, PhD [facility] [address], [address] [place], [place] [address]  Dear Dr. Lorelei Pont,   I saw your patient, Carmen Kim, upon your kind request in my neurologic clinic today for initial consultation of her sleep disorder, in particular, concern for underlying obstructive sleep apnea. The patient is unaccompanied today. As you know, [name] is a 51 year old right-handed woman with an underlying medical history of bipolar disorder, vitamin D deficiency, anemia, irritable bowel syndrome, and obesity, who reports snoring and excessive daytime somnolence. I reviewed your office note from 01/25/2017. Her Epworth sleepiness score is 10 out of 24, fatigue score is 49 out of 63. She feels like she is unable to sleep through the night, she has multiple nighttime awakenings. She is single and lives alone, no children. She works in [employer]. She tries to be in bed around 11 PM and wakeup time for work is usually between 5:30 and 6. She has had rare morning headaches and occasional nocturia. She denies telltale symptoms of restless leg syndrome. Going to sleep is not a problem but staying asleep is. She's not sure what wakes her up sometimes. She is scheduled to see a psychiatrist. She is currently not on any medications for her bipolar disease, was on Lamictal at one point and also lithium. She is established with a therapist for the past few months which has been helpful.  She is a nonsmoker and drinks alcohol occasionally, about once a week or so, she quit using marijuana 15 years ago. She drinks caffeine in the form of tea, usually one serving per day. She feels like her MGM may have had OSA, mother snored; died at 32 from brain aneurysm.   Her Past Medical History Is Significant For: Past Medical History:  Diagnosis Date  . Anemia   . Bipolar affect, depressed ([place])   .  Depressed   . IBS (irritable bowel syndrome)     Her Past Surgical History Is Significant For: No past surgical history on file.  Her Family History Is Significant For: Family History  Problem Relation Age of Onset  . Diabetes Mother   . Hypertension Mother   . Cerebral aneurysm Mother     Her Social History Is Significant For: Social History   Social History  . Marital status: Single    Spouse name: N/A  . Number of children: N/A  . Years of education: N/A   Social History Main Topics  . Smoking status: Never Smoker  . Smokeless tobacco: Never Used  . Alcohol use 0.6 oz/week    1 Glasses of wine per week  . Drug use: No  . Sexual activity: Not Asked   Other Topics Concern  . None   Social History Narrative  . None    Her Allergies Are:  Allergies  Allergen Reactions  . Wellbutrin [Bupropion] Other (See Comments)    seizures  :   Her Current Medications Are:  Outpatient Encounter Prescriptions as of 02/27/2017  Medication Sig  . Melatonin 5 MG CHEW Chew by mouth.  . Vitamin D, Ergocalciferol, (DRISDOL) 50000 units CAPS capsule Take 1 capsule (50,000 Units total) by mouth every 7 (seven) days.  . [DISCONTINUED] dicyclomine (BENTYL) 10 MG capsule Take 10 mg by mouth 4 (four) times daily as needed for spasms.  . [DISCONTINUED] lamoTRIgine (LAMICTAL) 100 MG tablet Take 200 mg  by mouth daily.  . [DISCONTINUED] lamoTRIgine (LAMICTAL) 25 MG CHEW chewable tablet Take 1 daily for 2 weeks, then increase to 2 daily for 2 weeks  . [DISCONTINUED] lithium carbonate (LITHOBID) 300 MG CR tablet Take 1 tablet (300 mg total) by mouth at bedtime.  . [DISCONTINUED] lithium carbonate 300 MG capsule Take 900 mg by mouth at bedtime.   No facility-administered encounter medications on file as of 02/27/2017.   :  Review of Systems:  Out of a complete 14 point review of systems, all are reviewed and negative with the exception of these symptoms as listed below: Review of Systems   Neurological:       Pt presents today to discuss her sleep. Pt has never had a sleep study. Pt does endorse snoring.  Epworth Sleepiness Scale 0= would never doze 1= slight chance of dozing 2= moderate chance of dozing 3= high chance of dozing  Sitting and reading: 2 Watching TV: 3 Sitting inactive in a public place (ex. Theater or meeting): 1 As a passenger in a car for an hour without a break: 1 Lying down to rest in the afternoon: 3 Sitting and talking to someone: 0 Sitting quietly after lunch (no alcohol): 0 In a car, while stopped in traffic: 0 Total: 10     Objective:  Neurological Exam  Physical Exam Physical Examination:   Vitals:   02/27/17 1612  BP: (!) 145/102  Pulse: 82   General Examination: The patient is a very pleasant 51 y.o. female in no acute distress. She appears well-developed and well-nourished and well groomed.   HEENT: Normocephalic, atraumatic, pupils are equal, round and reactive to light and accommodation. Funduscopic exam is normal with sharp disc margins noted. Extraocular tracking is good without limitation to gaze excursion or nystagmus noted. Normal smooth pursuit is noted. Hearing is grossly intact. Tympanic membranes are clear bilaterally. Face is symmetric with normal facial animation and normal facial sensation. Speech is clear with no dysarthria noted. There is no hypophonia. There is no lip, neck/head, jaw or voice tremor. Neck is supple with full range of passive and active motion. There are no carotid bruits on auscultation. Oropharynx exam reveals: mild mouth dryness, adequate dental hygiene and Mild to moderate airway crowding secondary to smaller airway entry, longer uvula, tonsils are 1-2+ in size. Mallampati is class II. Neck circumference is 14 and 58 inches. She has a mild overbite.   Chest: Clear to auscultation without wheezing, rhonchi or crackles noted.  Heart: S1+S2+0, regular and normal without murmurs, rubs or gallops  noted.   Abdomen: Soft, non-tender and non-distended with normal bowel sounds appreciated on auscultation.  Extremities: There is no pitting edema in the distal lower extremities bilaterally.   Skin: Warm and dry without trophic changes noted.  Musculoskeletal: exam reveals no obvious joint deformities, tenderness or joint swelling or erythema.   Neurologically:  Mental status: The patient is awake, alert and oriented in all 4 spheres. Her immediate and remote memory, attention, language skills and fund of knowledge are appropriate. There is no evidence of aphasia, agnosia, apraxia or anomia. Speech is clear with normal prosody and enunciation. Thought process is linear. Mood is normal and affect is normal.  Cranial nerves II - XII are as described above under HEENT exam. In addition: shoulder shrug is normal with equal shoulder height noted. Motor exam: Normal bulk, strength and tone is noted. There is no drift, tremor or rebound. Romberg is negative. Reflexes are 2+ throughout. Fine motor skills  and coordination: intact with normal finger taps, normal hand movements, normal rapid alternating patting, normal foot taps and normal foot agility.  Cerebellar testing: No dysmetria or intention tremor on finger to nose testing. Heel to shin is unremarkable bilaterally. There is no truncal or gait ataxia.  Sensory exam: intact to light touch in the upper and lower extremities.  Gait, station and balance: She stands easily. No veering to one side is noted. No leaning to one side is noted. Posture is age-appropriate and stance is narrow based. Gait shows normal stride length and normal pace. No problems turning are noted. Tandem walk is unremarkable.  Assessment and Plan:  In summary, Atina Feeley is a very pleasant 51 y.o.-year old female with an underlying medical history of bipolar disorder, vitamin D deficiency, anemia, irritable bowel syndrome, and obesity, whose history and physical exam are  concerning for obstructive sleep apnea (OSA). I had a long chat with the patient about my findings and the diagnosis of OSA, its prognosis and treatment options. We talked about medical treatments, surgical interventions and non-pharmacological approaches. I explained in particular the risks and ramifications of untreated moderate to severe OSA, especially with respect to developing cardiovascular disease down the Road, including congestive heart failure, difficult to treat hypertension, cardiac arrhythmias, or stroke. Even type 2 diabetes has, in part, been linked to untreated OSA. Symptoms of untreated OSA include daytime sleepiness, memory problems, mood irritability and mood disorder such as depression and anxiety, lack of energy, as well as recurrent headaches, especially morning headaches. We talked about trying to maintain a healthy lifestyle in general, as well as the importance of weight control. I encouraged the patient to eat healthy, exercise daily and keep well hydrated, to keep a scheduled bedtime and wake time routine, to not skip any meals and eat healthy snacks in between meals. I advised the patient not to drive when feeling sleepy. I recommended the following at this time: sleep study with potential positive airway pressure titration. (We will score hypopneas at 4%).   I explained the sleep test procedure to the patient and also outlined possible surgical and non-surgical treatment options of OSA, including the use of a custom-made dental device (which would require a referral to a specialist dentist or oral surgeon), upper airway surgical options, such as pillar implants, radiofrequency surgery, tongue base surgery, and UPPP (which would involve a referral to an ENT surgeon). Rarely, jaw surgery such as mandibular advancement may be considered.  I also explained the CPAP treatment option to the patient, who indicated that she would be willing to try CPAP if the need arises. I explained the  importance of being compliant with PAP treatment, not only for insurance purposes but primarily to improve Her symptoms, and for the patient's long term health benefit, including to reduce Her cardiovascular risks. I answered all her questions today and the patient was in agreement. I would like to see her back after the sleep study is completed and encouraged her to call with any interim questions, concerns, problems or updates.   Thank you very much for allowing me to participate in the care of this nice patient. If I can be of any further assistance to you please do not hesitate to call me at [phone].  Sincerely,   Star Age, MD, PhD

## 2017-03-07 NOTE — Progress Notes (Addendum)
[place] at [facility] [address], [place], [place] [address] [phone]  Date:  03/08/2017   Name:  Carmen Kim   DOB:  [birth year]   MRN:  [id number]  PCP:  Darreld Mclean, MD    Chief Complaint: Annual Exam (Pt here for CPE with PAP. Fasting for labs. Flu vaccine today. )   History of Present Illness:  Carmen Kim is a 51 y.o. 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.  In 2015 she had a "mental health breakdown," and was even homeless for a short period of time. Since then she has gotten back on her feet and is doing much better She just recently started seeing a therapist.    She is not on any medications right now but would like to start back on her meds for bipolar disorder She does not have a car- but has to rely on Lyft a lot of the time   She has been dx with bipolar disorder, does not have a psychiatrist right now  Her sleep is not that great - she is able to fall asleep, but will wake up easily a lot of the time She does snore, and is interested in getting a sleep study to eval for OSA  She is not fasting- she had some chicken and broccoli several hours ago.  She has been menopausal for about 3 years now   She has not history of CAD.  She is able to walk a long distance wihtout any CP or SOB  Will plan to taper  her back on Lamictal and lithium She had been on lithium ER 900 in the everning and lamictal 200 daily most recently and felt like she did well with this She does not want to go back on trazodone right now, and we will also not start her back on effexor   Right now she feels like her irritabilty is high, and she has some depression symptoms although not severe  No SI at this time  There are no active problems to display for this patient.  Flu shot: done today Tetanus vaccine: 2014- tdap Mammo: scheduled for later today Pap: today Colon cancer screening: she would like to do cologuard, it is covered by her insurnace  Full labs in August- all looked fine  She is doing some volunteer work in addition to her regular work- she is pretty tired Never had an abnormal pap  She has a therapist, and she is seeing an MD on Monday.   Patient Active Problem List   Diagnosis Date Noted  . Vitamin D deficiency 01/25/2017  . Chronic fatigue 01/25/2017  . Bipolar 2 disorder ([place]) 01/25/2017    Past Medical History:  Diagnosis Date  . Anemia   . Bipolar affect, depressed ([place])   .  Depressed   . IBS (irritable bowel syndrome)     No past surgical history on file.  Social History  Substance Use Topics  . Smoking status: Never Smoker  . Smokeless tobacco: Never Used  . Alcohol use 0.6 oz/week    1 Glasses of wine per week    Family History  Problem Relation Age of Onset  . Diabetes Mother   . Hypertension Mother   . Cerebral aneurysm Mother     Allergies  Allergen Reactions  . Wellbutrin [Bupropion] Other (See Comments)    seizures    Medication list has been reviewed and updated.  Current Outpatient Prescriptions on File Prior to Visit  Medication Sig Dispense Refill  . Melatonin 5 MG CHEW Chew 5 mg by mouth at bedtime.     . Vitamin D, Ergocalciferol, (DRISDOL) 50000 units CAPS capsule Take 1 capsule (50,000 Units total) by mouth every 7 (seven) days. (Patient not  taking: Reported on 03/08/2017) 12 capsule 0   No current facility-administered medications on file prior to visit.     Review of Systems:  As per HPI- otherwise negative. No Cp or SOB Admits she does not get a lot of exercise No nausea, vomiting or diarrhea    Physical Examination: Vitals:   03/08/17 0825  BP: 122/74  Pulse: 80  Temp: 98.4 F (36.9 C)  SpO2: 99%   Vitals:   03/08/17 0825  Weight: 218 lb 12.8 oz (99.2 kg)  Height: 5\' 4"  (1.626 m)   Body mass index is 37.56 kg/m. Ideal Body Weight: Weight in (lb) to have BMI = 25: 145.3  GEN: WDWN, NAD, Non-toxic, A & O x 3, obese, otherwise looks well HEENT: Atraumatic, Normocephalic. Neck supple. No masses, No LAD.  Bilateral TM wnl, oropharynx normal.  PEERL,EOMI.   Ears and Nose: No external deformity. CV: RRR, No M/G/R. No JVD. No thrill. No extra heart sounds. PULM: CTA B, no wheezes, crackles, rhonchi. No retractions. No resp. distress. No accessory muscle use. ABD: S, NT, ND, +BS. No rebound. No HSM. EXTR: No c/c/e NEURO Normal gait.  PSYCH: Normally interactive. Conversant. Not depressed or anxious appearing.  Calm demeanor.  Breast: normal exam, no masses/ dimpling/ discharge Pelvic: normal, no vaginal lesions or discharge. Uterus normal, no CMT, no adnexal tendereness or masses    Assessment and Plan: Physical exam  Screening for cervical cancer - Plan: Cytology - PAP  Colon cancer screening  Here today for a CPE Pap today cologuard ordered She has finished her vitamin D- will continue with daily OTC therapy Discussed health maint and encouraged her to discuss her sleep concerns with her psychiatrist who she is seeing next week   Signed Lamar Blinks, MD Received her pap 10/5 Results for orders placed or performed in visit on 03/08/17  Cytology - PAP  Result Value Ref Range   Adequacy      Satisfactory for evaluation  endocervical/transformation zone component PRESENT.   Diagnosis       NEGATIVE FOR INTRAEPITHELIAL LESIONS OR MALIGNANCY.   HPV NOT DETECTED    Material Submitted CervicoVaginal Pap [ThinPrep Imaged]

## 2017-03-08 ENCOUNTER — Ambulatory Visit (HOSPITAL_BASED_OUTPATIENT_CLINIC_OR_DEPARTMENT_OTHER)
Admission: RE | Admit: 2017-03-08 | Discharge: 2017-03-08 | Disposition: A | Discharge status: Home / Self Care | Payer: Managed Care, Other (non HMO) | Source: Ambulatory Visit | Attending: Family Medicine | Admitting: Family Medicine

## 2017-03-08 ENCOUNTER — Ambulatory Visit (INDEPENDENT_AMBULATORY_CARE_PROVIDER_SITE_OTHER): Payer: Managed Care, Other (non HMO) | Admitting: Family Medicine

## 2017-03-08 ENCOUNTER — Other Ambulatory Visit (HOSPITAL_COMMUNITY)
Admission: RE | Admit: 2017-03-08 | Discharge: 2017-03-08 | Disposition: A | Discharge status: Home / Self Care | Payer: Managed Care, Other (non HMO) | Source: Ambulatory Visit | Attending: Family Medicine | Admitting: Family Medicine

## 2017-03-08 VITALS — BP 122/74 | HR 80 | Temp 98.4°F | Ht 64.0 in | Wt 218.8 lb

## 2017-03-08 DIAGNOSIS — Z124 Encounter for screening for malignant neoplasm of cervix: Secondary | ICD-10-CM

## 2017-03-08 DIAGNOSIS — Z1211 Encounter for screening for malignant neoplasm of colon: Secondary | ICD-10-CM | POA: Diagnosis not present

## 2017-03-08 DIAGNOSIS — Z0000 Encounter for general adult medical examination without abnormal findings: Secondary | ICD-10-CM

## 2017-03-08 DIAGNOSIS — Z23 Encounter for immunization: Secondary | ICD-10-CM

## 2017-03-08 DIAGNOSIS — Z1231 Encounter for screening mammogram for malignant neoplasm of breast: Secondary | ICD-10-CM | POA: Diagnosis not present

## 2017-03-08 DIAGNOSIS — Z1239 Encounter for other screening for malignant neoplasm of breast: Secondary | ICD-10-CM

## 2017-03-08 NOTE — Patient Instructions (Addendum)
It was very nice to see you today!   We will set you up for cologaurd testing to screen for colon cancer Will be in touch with your pap and will watch for your mammogram results  We can plan to repeat your vitamin D the next time we draw blood- once you finish your once a week rx, continue taking 1,000 to 2,000 IU of vitamin D daily (avaialble OTC) Do try to take care of yourself- your sleep, exercise, and mental health are important!    Health Maintenance, Female Adopting a healthy lifestyle and getting preventive care can go a long way to promote health and wellness. Talk with your health care provider about what schedule of regular examinations is right for you. This is a good chance for you to check in with your provider about disease prevention and staying healthy. In between checkups, there are plenty of things you can do on your own. Experts have done a lot of research about which lifestyle changes and preventive measures are most likely to keep you healthy. Ask your health care provider for more information. Weight and diet Eat a healthy diet  Be sure to include plenty of vegetables, fruits, low-fat dairy products, and lean protein.  Do not eat a lot of foods high in solid fats, added sugars, or salt.  Get regular exercise. This is one of the most important things you can do for your health. ? Most adults should exercise for at least 150 minutes each week. The exercise should increase your heart rate and make you sweat (moderate-intensity exercise). ? Most adults should also do strengthening exercises at least twice a week. This is in addition to the moderate-intensity exercise.  Maintain a healthy weight  Body mass index (BMI) is a measurement that can be used to identify possible weight problems. It estimates body fat based on height and weight. Your health care provider can help determine your BMI and help you achieve or maintain a healthy weight.  For females 70 years of age and  older: ? A BMI below 18.5 is considered underweight. ? A BMI of 18.5 to 24.9 is normal. ? A BMI of 25 to 29.9 is considered overweight. ? A BMI of 30 and above is considered obese.  Watch levels of cholesterol and blood lipids  You should start having your blood tested for lipids and cholesterol at 51 years of age, then have this test every 5 years.  You may need to have your cholesterol levels checked more often if: ? Your lipid or cholesterol levels are high. ? You are older than 51 years of age. ? You are at high risk for heart disease.  Cancer screening Lung Cancer  Lung cancer screening is recommended for adults 27-62 years old who are at high risk for lung cancer because of a history of smoking.  A yearly low-dose CT scan of the lungs is recommended for people who: ? Currently smoke. ? Have quit within the past 15 years. ? Have at least a 30-pack-year history of smoking. A pack year is smoking an average of one pack of cigarettes a day for 1 year.  Yearly screening should continue until it has been 15 years since you quit.  Yearly screening should stop if you develop a health problem that would prevent you from having lung cancer treatment.  Breast Cancer  Practice breast self-awareness. This means understanding how your breasts normally appear and feel.  It also means doing regular breast self-exams. Let your  health care provider know about any changes, no matter how small.  If you are in your 20s or 30s, you should have a clinical breast exam (CBE) by a health care provider every 1-3 years as part of a regular health exam.  If you are 25 or older, have a CBE every year. Also consider having a breast X-ray (mammogram) every year.  If you have a family history of breast cancer, talk to your health care provider about genetic screening.  If you are at high risk for breast cancer, talk to your health care provider about having an MRI and a mammogram every year.  Breast  cancer gene (BRCA) assessment is recommended for women who have family members with BRCA-related cancers. BRCA-related cancers include: ? Breast. ? Ovarian. ? Tubal. ? Peritoneal cancers.  Results of the assessment will determine the need for genetic counseling and BRCA1 and BRCA2 testing.  Cervical Cancer Your health care provider may recommend that you be screened regularly for cancer of the pelvic organs (ovaries, uterus, and vagina). This screening involves a pelvic examination, including checking for microscopic changes to the surface of your cervix (Pap test). You may be encouraged to have this screening done every 3 years, beginning at age 19.  For women ages 93-65, health care providers may recommend pelvic exams and Pap testing every 3 years, or they may recommend the Pap and pelvic exam, combined with testing for human papilloma virus (HPV), every 5 years. Some types of HPV increase your risk of cervical cancer. Testing for HPV may also be done on women of any age with unclear Pap test results.  Other health care providers may not recommend any screening for nonpregnant women who are considered low risk for pelvic cancer and who do not have symptoms. Ask your health care provider if a screening pelvic exam is right for you.  If you have had past treatment for cervical cancer or a condition that could lead to cancer, you need Pap tests and screening for cancer for at least 20 years after your treatment. If Pap tests have been discontinued, your risk factors (such as having a new sexual partner) need to be reassessed to determine if screening should resume. Some women have medical problems that increase the chance of getting cervical cancer. In these cases, your health care provider may recommend more frequent screening and Pap tests.  Colorectal Cancer  This type of cancer can be detected and often prevented.  Routine colorectal cancer screening usually begins at 51 years of age and  continues through 51 years of age.  Your health care provider may recommend screening at an earlier age if you have risk factors for colon cancer.  Your health care provider may also recommend using home test kits to check for hidden blood in the stool.  A small camera at the end of a tube can be used to examine your colon directly (sigmoidoscopy or colonoscopy). This is done to check for the earliest forms of colorectal cancer.  Routine screening usually begins at age 39.  Direct examination of the colon should be repeated every 5-10 years through 51 years of age. However, you may need to be screened more often if early forms of precancerous polyps or small growths are found.  Skin Cancer  Check your skin from head to toe regularly.  Tell your health care provider about any new moles or changes in moles, especially if there is a change in a mole's shape or color.  Also  tell your health care provider if you have a mole that is larger than the size of a pencil eraser.  Always use sunscreen. Apply sunscreen liberally and repeatedly throughout the day.  Protect yourself by wearing long sleeves, pants, a wide-brimmed hat, and sunglasses whenever you are outside.  Heart disease, diabetes, and high blood pressure  High blood pressure causes heart disease and increases the risk of stroke. High blood pressure is more likely to develop in: ? People who have blood pressure in the high end of the normal range (130-139/85-89 mm Hg). ? People who are overweight or obese. ? People who are African American.  If you are 85-33 years of age, have your blood pressure checked every 3-5 years. If you are 2 years of age or older, have your blood pressure checked every year. You should have your blood pressure measured twice-once when you are at a hospital or clinic, and once when you are not at a hospital or clinic. Record the average of the two measurements. To check your blood pressure when you are not  at a hospital or clinic, you can use: ? An automated blood pressure machine at a pharmacy. ? A home blood pressure monitor.  If you are between 44 years and 10 years old, ask your health care provider if you should take aspirin to prevent strokes.  Have regular diabetes screenings. This involves taking a blood sample to check your fasting blood sugar level. ? If you are at a normal weight and have a low risk for diabetes, have this test once every three years after 50 years of age. ? If you are overweight and have a high risk for diabetes, consider being tested at a younger age or more often. Preventing infection Hepatitis B  If you have a higher risk for hepatitis B, you should be screened for this virus. You are considered at high risk for hepatitis B if: ? You were born in a country where hepatitis B is common. Ask your health care provider which countries are considered high risk. ? Your parents were born in a high-risk country, and you have not been immunized against hepatitis B (hepatitis B vaccine). ? You have HIV or AIDS. ? You use needles to inject street drugs. ? You live with someone who has hepatitis B. ? You have had sex with someone who has hepatitis B. ? You get hemodialysis treatment. ? You take certain medicines for conditions, including cancer, organ transplantation, and autoimmune conditions.  Hepatitis C  Blood testing is recommended for: ? Everyone born from [birth year] through 1965. ? Anyone with known risk factors for hepatitis C.  Sexually transmitted infections (STIs)  You should be screened for sexually transmitted infections (STIs) including gonorrhea and chlamydia if: ? You are sexually active and are younger than 51 years of age. ? You are older than 51 years of age and your health care provider tells you that you are at risk for this type of infection. ? Your sexual activity has changed since you were last screened and you are at an increased risk for chlamydia  or gonorrhea. Ask your health care provider if you are at risk.  If you do not have HIV, but are at risk, it may be recommended that you take a prescription medicine daily to prevent HIV infection. This is called pre-exposure prophylaxis (PrEP). You are considered at risk if: ? You are sexually active and do not regularly use condoms or know the HIV status of your  partner(s). ? You take drugs by injection. ? You are sexually active with a partner who has HIV.  Talk with your health care provider about whether you are at high risk of being infected with HIV. If you choose to begin PrEP, you should first be tested for HIV. You should then be tested every 3 months for as long as you are taking PrEP. Pregnancy  If you are premenopausal and you may become pregnant, ask your health care provider about preconception counseling.  If you may become pregnant, take 400 to 800 micrograms (mcg) of folic acid every day.  If you want to prevent pregnancy, talk to your health care provider about birth control (contraception). Osteoporosis and menopause  Osteoporosis is a disease in which the bones lose minerals and strength with aging. This can result in serious bone fractures. Your risk for osteoporosis can be identified using a bone density scan.  If you are 93 years of age or older, or if you are at risk for osteoporosis and fractures, ask your health care provider if you should be screened.  Ask your health care provider whether you should take a calcium or vitamin D supplement to lower your risk for osteoporosis.  Menopause may have certain physical symptoms and risks.  Hormone replacement therapy may reduce some of these symptoms and risks. Talk to your health care provider about whether hormone replacement therapy is right for you. Follow these instructions at home:  Schedule regular health, dental, and eye exams.  Stay current with your immunizations.  Do not use any tobacco products  including cigarettes, chewing tobacco, or electronic cigarettes.  If you are pregnant, do not drink alcohol.  If you are breastfeeding, limit how much and how often you drink alcohol.  Limit alcohol intake to no more than 1 drink per day for nonpregnant women. One drink equals 12 ounces of beer, 5 ounces of wine, or 1 ounces of hard liquor.  Do not use street drugs.  Do not share needles.  Ask your health care provider for help if you need support or information about quitting drugs.  Tell your health care provider if you often feel depressed.  Tell your health care provider if you have ever been abused or do not feel safe at home. This information is not intended to replace advice given to you by your health care provider. Make sure you discuss any questions you have with your health care provider. Document Released: 12/05/2010 Document Revised: 10/28/2015 Document Reviewed: 02/23/2015 Elsevier Interactive Patient Education  [employer].

## 2017-03-09 ENCOUNTER — Telehealth: Payer: Self-pay | Admitting: Family Medicine

## 2017-03-09 ENCOUNTER — Encounter: Payer: Self-pay | Admitting: Family Medicine

## 2017-03-09 DIAGNOSIS — E559 Vitamin D deficiency, unspecified: Secondary | ICD-10-CM

## 2017-03-09 LAB — CYTOLOGY - PAP
Diagnosis: NEGATIVE
HPV: NOT DETECTED

## 2017-03-12 ENCOUNTER — Encounter: Payer: Self-pay | Admitting: Family Medicine

## 2017-03-12 ENCOUNTER — Telehealth: Payer: Self-pay | Admitting: *Deleted

## 2017-03-12 NOTE — Telephone Encounter (Signed)
Pt is requesting refill on Ergocalciferol 50,000 units weekly.

## 2017-03-12 NOTE — Telephone Encounter (Signed)
Received Cologuard Incomplete Order: [id number]; order has been faxed back d/t missing information necessary to send patient a Cologuard kit. [employer] has made several attempts to reach patient by phone and has not been able to reach them; forwarded to provider/SLS 10/08

## 2017-03-14 NOTE — Telephone Encounter (Signed)
Order form completed and faxed to [employer] at [phone]. Form sent for scanning.

## 2017-03-22 ENCOUNTER — Telehealth: Payer: Self-pay | Admitting: *Deleted

## 2017-03-22 NOTE — Telephone Encounter (Signed)
Received Cologuard Incomplete Order: [id number]; order has been faxed back d/t missing information necessary to send patient a Cologuard kit. [employer] has made several attempts to reach patient by phone and has not been able to reach them; forwarded to provider/SLS 10/18

## 2017-04-02 ENCOUNTER — Telehealth: Payer: Self-pay | Admitting: Neurology

## 2017-04-02 DIAGNOSIS — G4719 Other hypersomnia: Secondary | ICD-10-CM

## 2017-04-02 NOTE — Telephone Encounter (Signed)
[employer] denied Split sleep study and approved a HST.  Can I get an order for HST? °

## 2017-04-05 ENCOUNTER — Telehealth: Payer: Self-pay | Admitting: Neurology

## 2017-04-05 NOTE — Telephone Encounter (Signed)
We have attempted to call the patient 2 times to schedule sleep study. Patient has been unavailable at the phone numbers we have on file and has not returned our calls. At this point we will send a letter asking pt to please contact the sleep lab to schedule their sleep study. If patient calls back we will schedule them for their sleep study. ° °

## 2017-04-09 ENCOUNTER — Ambulatory Visit (INDEPENDENT_AMBULATORY_CARE_PROVIDER_SITE_OTHER): Payer: Managed Care, Other (non HMO) | Admitting: Neurology

## 2017-04-09 DIAGNOSIS — G4733 Obstructive sleep apnea (adult) (pediatric): Secondary | ICD-10-CM | POA: Diagnosis not present

## 2017-04-09 DIAGNOSIS — G4719 Other hypersomnia: Secondary | ICD-10-CM

## 2017-04-17 ENCOUNTER — Other Ambulatory Visit: Payer: Self-pay | Admitting: Neurology

## 2017-04-17 ENCOUNTER — Telehealth: Payer: Self-pay

## 2017-04-17 DIAGNOSIS — G4733 Obstructive sleep apnea (adult) (pediatric): Secondary | ICD-10-CM

## 2017-04-17 NOTE — Telephone Encounter (Signed)
-----   Message from Star Age, MD sent at 04/17/2017  7:51 AM EST ----- Patient referred by Dr. Lorelei Pont, seen by me on 02/27/17, HST on 04/10/17.     Please call and notify the patient that the recent home sleep test did confirm the diagnosis of obstructive sleep apnea. OSA is overall mild, but worth treating to see if she feels better after treatment. To that end I recommend treatment for this in the form of autoPAP, which means, that we don't have to bring her in for a sleep study, but will let her try an autoPAP machine at home, through a [employer] (of her choice, or as per insurance requirement). The DME representative will educate her on how to use the machine, how to put the mask on, etc. I have placed an order in the chart. Please send referral, talk to patient, send report to PCP and referring MD. We will need a FU in sleep clinic for 10 weeks post-PAP set up, please arrange that as well. Thanks,   Star Age, MD, PhD [facility])

## 2017-04-17 NOTE — Telephone Encounter (Addendum)
I called pt to discuss her sleep study results. No answer, left a message asking her to call me back. 

## 2017-04-17 NOTE — Procedures (Signed)
  [facility] @[facility] [address] [place], [place] [address] NAME:    Carmen Kim                                                         DOB: Nov 01, 1965 MEDICAL RECORD NUMBER    [id number]                                           DOS: 04/09/17 REFERRING PHYSICIAN: Lamar Blinks, MD STUDY PERFORMED: Home Sleep Study HISTORY: 51 year old woman with a history of bipolar disorder, vitamin D deficiency, anemia, irritable bowel syndrome, and obesity, who reports snoring and excessive daytime somnolence.  Her Epworth sleepiness score is 10 out of 24.   STUDY RESULTS: Total Recording:   8 Hours, 26 Minutes, Total Duration of Valid Testing Time: 4 hours, 50 min Total Apnea/Hypopnea Index (AHI):  8.9/Hour Average Oxygen Saturation:     95% Lowest Oxygen Saturation:   80%  Time Oxygen Saturation Below or at 88%: 5 min (1%) Average Heart Rate:      73 BPM IMPRESSION: OSA RECOMMENDATION: This home sleep test demonstrates overall mild obstructive sleep apnea with a total AHI of 8.9/hour and O2 nadir of 80%. Given the patient's medical history and sleep related complaints, treatment with positive airway pressure is recommended. The treatment can be achieved in the form of autoPAP trial/titration at home. A full night CPAP titration study will help with proper treatment settings and mask fitting, if needed.  Please note that untreated obstructive sleep apnea carries additional perioperative morbidity. Patients with significant obstructive sleep apnea should receive perioperative PAP therapy and the surgeons and particularly the anesthesiologist should be informed of the diagnosis and the severity of the sleep disordered breathing. The patient should be cautioned not to drive, work at heights, or operate dangerous or heavy equipment when tired or sleepy. Review and reiteration of good sleep hygiene measures should be pursued with any patient. The patient and his referring provider  will be notified of the test results. The patient will be seen in follow up in sleep clinic at [facility].  I certify that I have reviewed the raw data recording prior to the issuance of this report in accordance with the standards of the American Academy of Sleep Medicine (AASM).  Star Age, MD, PhD [facility]) Diplomat, ABPN (neurology and sleep)

## 2017-04-17 NOTE — Progress Notes (Signed)
Patient referred by Dr. Lorelei Pont, seen by me on 02/27/17, HST on 04/10/17.     Please call and notify the patient that the recent home sleep test did confirm the diagnosis of obstructive sleep apnea. OSA is overall mild, but worth treating to see if she feels better after treatment. To that end I recommend treatment for this in the form of autoPAP, which means, that we don't have to bring her in for a sleep study, but will let her try an autoPAP machine at home, through a [employer] (of her choice, or as per insurance requirement). The DME representative will educate her on how to use the machine, how to put the mask on, etc. I have placed an order in the chart. Please send referral, talk to patient, send report to PCP and referring MD. We will need a FU in sleep clinic for 10 weeks post-PAP set up, please arrange that as well. Thanks,   Star Age, MD, PhD [facility])

## 2017-04-18 NOTE — Telephone Encounter (Signed)
I called pt to discuss. No answer, left a message asking her to call me back. 

## 2017-04-19 NOTE — Telephone Encounter (Signed)
I called pt. I advised pt that Dr. Rexene Alberts reviewed their sleep study results and found that pt has mild osa. Dr. Rexene Alberts recommends that pt start an auto pa at home to see if she feels better after treatment. I reviewed PAP compliance expectations with the pt. Pt is agreeable to starting an auto-PAP. I advised pt that an order will be sent to a DME, [facility], and [facility] will call the pt within about one week after they file with the pt's insurance. [facility] will show the pt how to use the machine, fit for masks, and troubleshoot the auto-PAP if needed. A follow up appt was made for insurance purposes with Dr. Rexene Alberts on 07/11/17 at 3:00pm. Pt verbalized understanding to arrive 15 minutes early and bring their auto-PAP. A letter with all of this information in it will be mailed to the pt as a reminder. I verified with the pt that the address we have on file is correct. Pt verbalized understanding of results. Pt had no questions at this time but was encouraged to call back if questions arise.

## 2017-04-19 NOTE — Telephone Encounter (Signed)
I called pt to discuss. No answer, left a message asking her to call me back. This is my third attempt at reaching pt by phone. I will send her a letter asking her to call me back.

## 2017-05-09 ENCOUNTER — Other Ambulatory Visit: Payer: Self-pay | Admitting: Family Medicine

## 2017-05-09 DIAGNOSIS — E559 Vitamin D deficiency, unspecified: Secondary | ICD-10-CM

## 2017-05-11 NOTE — Telephone Encounter (Signed)
Pt requesting refill on ergocalciferol 50,000 units. Please advise.

## 2017-07-06 ENCOUNTER — Encounter: Payer: Self-pay | Admitting: Neurology

## 2017-07-31 ENCOUNTER — Ambulatory Visit: Payer: Self-pay | Admitting: Neurology

## 2017-08-20 ENCOUNTER — Encounter: Payer: Self-pay | Admitting: Neurology

## 2017-08-23 ENCOUNTER — Ambulatory Visit (INDEPENDENT_AMBULATORY_CARE_PROVIDER_SITE_OTHER): Payer: Managed Care, Other (non HMO) | Admitting: Neurology

## 2017-08-23 ENCOUNTER — Encounter: Payer: Self-pay | Admitting: Neurology

## 2017-08-23 VITALS — BP 124/88 | HR 69 | Ht 64.0 in | Wt 235.0 lb

## 2017-08-23 DIAGNOSIS — G4733 Obstructive sleep apnea (adult) (pediatric): Secondary | ICD-10-CM

## 2017-08-23 DIAGNOSIS — F39 Unspecified mood [affective] disorder: Secondary | ICD-10-CM | POA: Diagnosis not present

## 2017-08-23 DIAGNOSIS — Z9989 Dependence on other enabling machines and devices: Secondary | ICD-10-CM

## 2017-08-23 NOTE — Patient Instructions (Addendum)
Please continue to work on being consistent with your autoPAP. Please continue to work on weight loss.  Please continue using your autoPAP regularly. While your insurance may require that you use PAP at least 4 hours each night on 70% of the nights, I recommend, that you not skip any nights and use it throughout the night if you can. Getting used to PAP and staying with the treatment long term does take time and patience and discipline. Untreated obstructive sleep apnea when it is moderate to severe can have an adverse impact on cardiovascular health and raise her risk for heart disease, arrhythmias, hypertension, congestive heart failure, stroke and diabetes. Untreated obstructive sleep apnea causes sleep disruption, nonrestorative sleep, and sleep deprivation. This can have an impact on your day to day functioning and cause daytime sleepiness and impairment of cognitive function, memory loss, mood disturbance, and problems focussing. Using PAP regularly can improve these symptoms.  We will see you back in about 6 months.

## 2017-08-23 NOTE — Progress Notes (Signed)
Subjective:    Patient ID: Carmen Kim is a 52 y.o. female.  HPI     Interim history:   Carmen Kim is a 52 year old right-handed woman with an underlying medical history of bipolar disorder, vitamin D deficiency, anemia, irritable bowel syndrome, and obesity, who presents for follow-up consultation of her sleep disorder, after recent home sleep testing and starting AutoPap therapy at home. The patient is unaccompanied today. I first met her on 02/27/2017 at the request of her primary care physician, at which time the patient reported snoring and daytime somnolence, sleep disruption. I invited her for a sleep study. Her insurance denied an attended sleep study. She at home sleep test on 04/09/2017 which indicated overall mild sleep apnea with an AHI of 8.9 per hour, average oxygen saturations of 95%, nadir of 80%. I suggested we proceed with AutoPap therapy at home.   Today, 08/23/2017: I reviewed her autoPAP compliance data from 07/22/2017 through 08/20/2017 which is a total of 30 days, during which time she used her AutoPap machine 19 days with percent used days greater than 4 hours at 57%, indicating suboptimal compliance with an average usage of 5 hours and 54 minutes, residual AHI at goal at 1.2 per hour, 95th percentile at 9.9 cm, leak on the lower end with the 95th percentile at 6.2 L/m on a pressure range of 5-12 cm with EPR. During 06/07/2017 through 07/06/2017 which is a total of 30 days, she had a 4+ hour compliance percentage of 93%, indicating excellent compliance with an average usage at that time of 6 hours and 22 minutes. She has been on AutoPap therapy for the past nearly 3 months. Her compliance has declined in the past 1 month. She reports doing okay off and on. She istrying to be consistent with her AutoPap. She was better with her compliance in the beginning she admits. Lately, in the past few weeks her depression has flared up. She is very exhausted when she comes home from  work. She also has additional work-related stress. She currently not on an antidepressant but tried antidepressants in the past which did not help very much. She would be willing to consider something else. She has not seen her psychiatrist and had to reschedule some appointments because of her work. She is still struggling to find a counselor that would take her insurance or offer appointments that she can make. She tries to hydrate well. She is currently on lithium and Lamictal. She is more sleepy when she is depressed she admits. She has had some weight gain as well.  The patient's allergies, current medications, family history, past medical history, past social history, past surgical history and problem list were reviewed and updated as appropriate.   Previously:   02/27/2017: (She) reports snoring and excessive daytime somnolence. I reviewed your office note from 01/25/2017. Her Epworth sleepiness score is 10 out of 24, fatigue score is 49 out of 63. She feels like she is unable to sleep through the night, she has multiple nighttime awakenings. She is single and lives alone, no children. She works in [employer]. She tries to be in bed around 11 PM and wakeup time for work is usually between 5:30 and 6. She has had rare morning headaches and occasional nocturia. She denies telltale symptoms of restless leg syndrome. Going to sleep is not a problem but staying asleep is. She's not sure what wakes her up sometimes. She is scheduled to see a psychiatrist. She is currently not on any  medications for her bipolar disease, was on Lamictal at one point and also lithium. She is established with a therapist for the past few months which has been helpful.  She is a nonsmoker and drinks alcohol occasionally, about once a week or so, she quit using marijuana 15 years ago. She drinks caffeine in the form of tea, usually one serving per day. She feels like her MGM may have had OSA, mother snored; died at 82 from brain  aneurysm.   Her Past Medical History Is Significant For: Past Medical History:  Diagnosis Date  . Anemia   . Bipolar affect, depressed ([place])   . Depressed   . IBS (irritable bowel syndrome)     Her Past Surgical History Is Significant For: No past surgical history on file.  Her Family History Is Significant For: Family History  Problem Relation Age of Onset  . Diabetes Mother   . Hypertension Mother   . Cerebral aneurysm Mother     Her Social History Is Significant For: Social History   Socioeconomic History  . Marital status: Single    Spouse name: Not on file  . Number of children: Not on file  . Years of education: Not on file  . Highest education level: Not on file  Occupational History  . Not on file  Social Needs  . Financial resource strain: Not on file  . Food insecurity:    Worry: Not on file    Inability: Not on file  . Transportation needs:    Medical: Not on file    Non-medical: Not on file  Tobacco Use  . Smoking status: Never Smoker  . Smokeless tobacco: Never Used  Substance and Sexual Activity  . Alcohol use: Yes    Alcohol/week: 0.6 oz    Types: 1 Glasses of wine per week  . Drug use: No  . Sexual activity: Not on file  Lifestyle  . Physical activity:    Days per week: Not on file    Minutes per session: Not on file  . Stress: Not on file  Relationships  . Social connections:    Talks on phone: Not on file    Gets together: Not on file    Attends religious service: Not on file    Active member of club or organization: Not on file    Attends meetings of clubs or organizations: Not on file    Relationship status: Not on file  Other Topics Concern  . Not on file  Social History Narrative  . Not on file    Her Allergies Are:  Allergies  Allergen Reactions  . Wellbutrin [Bupropion] Other (See Comments)    seizures  :   Her Current Medications Are:  Outpatient Encounter Medications as of 08/23/2017  Medication Sig  . clonazePAM  (KLONOPIN) 1 MG tablet TAKE 1 TABLET BY MOUTH AT BEDTIME AS NEEDED FOR SLEEP AND ANXIETY  . lamoTRIgine (LAMICTAL) 100 MG tablet TAKE 1/2 TABLET DAILY FOR MOOD  . lithium carbonate (ESKALITH) 450 MG CR tablet TAKE 1 TABLET BY MOUTH TWICE DAILY FOR MOOD  . [DISCONTINUED] Melatonin 5 MG CHEW Chew 5 mg by mouth at bedtime.   . [DISCONTINUED] Vitamin D, Ergocalciferol, (DRISDOL) 50000 units CAPS capsule TAKE 1 CAPSULE BY MOUTH EVERY 7 DAYS   No facility-administered encounter medications on file as of 08/23/2017.   :  Review of Systems:  Out of a complete 14 point review of systems, all are reviewed and negative with the exception  of these symptoms as listed below:  Review of Systems  Neurological:       Patient doing ok with her machine, just still trying to get used to it.     Objective:  Neurological Exam  Physical Exam Physical Examination:   Vitals:   08/23/17 1508  BP: 124/88  Pulse: 69    General Examination: The patient is a very pleasant 52 y.o. female in no acute distress. She appears well-developed and well-nourished and well groomed. Mildly depressed appearing.    HEENT: Normocephalic, atraumatic, pupils are equal, round and reactive to light and accommodation. Corrective eyeglasses. Extraocular tracking is good without limitation to gaze excursion or nystagmus noted. Normal smooth pursuit is noted. Hearing is grossly intact. Face is symmetric with normal facial animation and normal facial sensation. Speech is clear with no dysarthria noted. There is no hypophonia. There is no lip, neck/head, jaw or voice tremor. Neck is supple with full range of passive and active motion. There are no carotid bruits on auscultation. Oropharynx exam reveals: mild to moderate mouth dryness, adequate dental hygiene, mild to moderate airway crowding secondary to smaller airway entry, longer uvula, tonsils are 1-2+ in size. Mallampati is class II.   Chest: Clear to auscultation without wheezing,  rhonchi or crackles noted.  Heart: S1+S2+0, regular and normal without murmurs, rubs or gallops noted.   Abdomen: Soft, non-tender and non-distended.  Extremities: There is no edema.    Skin: Warm and dry without trophic changes noted.  Musculoskeletal: exam reveals no obvious joint deformities, tenderness or joint swelling or erythema.   Neurologically:  Mental status: The patient is awake, alert and oriented in all 4 spheres. Her immediate and remote memory, attention, language skills and fund of knowledge are appropriate. There is no evidence of aphasia, agnosia, apraxia or anomia. Speech is clear with normal prosody and enunciation. Thought process is linear. Mood is mildly depressed, affect flat, softer volume, mild psychomotor retardation.  Cranial nerves II - XII are as described above under HEENT exam. In addition: shoulder shrug is normal with equal shoulder height noted. Motor exam: Normal bulk, strength and tone is noted. There is no tremor. Fine motor skills and coordination: grossly intact.  Cerebellar testing: No dysmetria or intention tremor. There is no truncal or gait ataxia.  Sensory exam: intact to light touch in the upper and lower extremities.  Gait, station and balance: She stands easily. No veering to one side is noted. No leaning to one side is noted. Posture is age-appropriate and stance is narrow based. Gait shows normal stride length and normal pace. No problems turning are noted.   Assessment and Plan:  In summary, Carmen Kim is a very pleasant 52 year old female with an underlying medical history of bipolar disorder, vitamin D deficiency, anemia, irritable bowel syndrome, and obesity, who presents for follow-up consultation of her sleep disturbance, in particular her OSA. She had a home sleep test on 04/09/2017 indicating mild obstructive sleep apnea and she was advised to try AutoPap therapy at home. She has been compliant with treatment, less so in the  past month but had great compliance in the month of early January through early February.unfortunately, she does not have a telltale response from sleep apnea treatment with AutoPap, she reports that she had increase in her depressive symptoms recently. She is encouraged to make a follow-up appointment with her psychiatrist. Maybe she is a candidate for one of the knee were antidepressants. She is also encouraged to try to find  a Social worker. Unfortunately, from my end of things as not a whole lot else I can suggest. She is encouraged to try to be consistent with AutoPap therapy as treating sleep apnea can have benefits with respect to daytime tiredness, even mood disorders respond favorably on sleep apnea treatment. She is encouraged to strive for weight loss. I suggested a 6 month follow-up, sooner if needed. She can see one of our nurse practitioners next time. We talked about her home sleep test results in detail. We also reviewed her compliance data together. I answered all her questions today and she was in agreement.  I spent 25 minutes in total face-to-face time with the patient, more than 50% of which was spent in counseling and coordination of care, reviewing test results, reviewing medication and discussing or reviewing the diagnosis of OSA, its prognosis and treatment options. Pertinent laboratory and imaging test results that were available during this visit with the patient were reviewed by me and considered in my medical decision making (see chart for details).

## 2018-02-21 ENCOUNTER — Telehealth: Payer: Self-pay | Admitting: *Deleted

## 2018-02-21 NOTE — Telephone Encounter (Signed)
LVM requesting patient call back re: CPAP compliance. Advised her this RN cannot find documentation that she has used it since March 2019. Reminded her she has FU with NP on Mon at 7:45 am. Left office number for call back.

## 2018-02-25 ENCOUNTER — Telehealth: Payer: Self-pay | Admitting: Adult Health

## 2018-02-25 ENCOUNTER — Ambulatory Visit: Payer: Managed Care, Other (non HMO) | Admitting: Adult Health

## 2018-02-25 NOTE — Telephone Encounter (Signed)
Patient no showed appointment

## 2018-02-27 ENCOUNTER — Encounter: Payer: Self-pay | Admitting: Adult Health

## 2018-03-13 ENCOUNTER — Telehealth: Payer: Self-pay | Admitting: *Deleted

## 2018-03-13 NOTE — Telephone Encounter (Signed)
Received Cologuard Order Cancellation: [phone]; order has been Cancelled because it has been Inactive, and has exceeded the 365 days from the Initial order; forwarded to provider/SLS 10/09

## 2018-08-05 ENCOUNTER — Encounter: Payer: Self-pay | Admitting: Nurse Practitioner

## 2018-08-05 ENCOUNTER — Ambulatory Visit (INDEPENDENT_AMBULATORY_CARE_PROVIDER_SITE_OTHER): Payer: Managed Care, Other (non HMO) | Admitting: Nurse Practitioner

## 2018-08-05 VITALS — BP 118/88 | HR 70 | Ht 64.0 in | Wt 214.6 lb

## 2018-08-05 DIAGNOSIS — E559 Vitamin D deficiency, unspecified: Secondary | ICD-10-CM

## 2018-08-05 DIAGNOSIS — F3181 Bipolar II disorder: Secondary | ICD-10-CM | POA: Diagnosis not present

## 2018-08-05 DIAGNOSIS — Z114 Encounter for screening for human immunodeficiency virus [HIV]: Secondary | ICD-10-CM

## 2018-08-05 DIAGNOSIS — K921 Melena: Secondary | ICD-10-CM | POA: Insufficient documentation

## 2018-08-05 DIAGNOSIS — Z139 Encounter for screening, unspecified: Secondary | ICD-10-CM

## 2018-08-05 DIAGNOSIS — K589 Irritable bowel syndrome without diarrhea: Secondary | ICD-10-CM | POA: Diagnosis not present

## 2018-08-05 NOTE — Progress Notes (Signed)
Subjective:     Patient ID: Carmen Kim , female    DOB: 08-02-1965 , 53 y.o.   MRN: [id number]   Chief Complaint  Patient presents with  . New Patient (Initial Visit)    ibs issues   . Rectal Bleeding    x3 weeks  with clotting    HPI  Here to establish - had been seeing Dr. Edilia Bo has been a year (felt rushed).  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  Single, no children.  [place] - Mother - Brain aneurysm (79 y/o) - deceased, depression.  Father - unknown.  Half siblings - unknown. Maternal grandmother  Pancreatic cancer, DM.  Maternal grandfather - lung cancer.  She has had to take iron supplements 3-4 years ago.  Also has a history of low vitamin d has taken high dose in the past.  Irritable bowel since the age of 53 y/o.    Soon as she eats will have diarrhea.  She had blood with clots the last couple months.  No colonoscopy.    Bipolar - 2003.   She is on propanolol for tremor    Rectal Bleeding   The current episode started more than 1 week ago. The onset was gradual. The problem occurs occasionally. The problem has been gradually improving. The stool is described as bloody. Pertinent negatives include no anorexia, no fever, no nausea, no rectal pain, no vaginal bleeding, no chest pain, no headaches and no coughing. Diarrhea timing: becoming more solid in the last few days. Her past medical history is significant for inflammatory bowel disease. There were no sick contacts. She has received no recent medical care (last seen more than 1 year ago).     Past Medical History:  Diagnosis Date  . Anemia   . Bipolar affect, depressed ([place])   . Depressed   . IBS (irritable bowel syndrome)      Family History  Problem Relation Age of Onset  . Diabetes Mother   . Hypertension Mother   . Cerebral aneurysm Mother      Current Outpatient Medications:  .  clonazePAM (KLONOPIN) 1 MG tablet, Take 1 mg by mouth 2 (two) times daily. One at bedtime as needed for sleep, Disp: ,  Rfl:  .  FLUoxetine (PROZAC) 40 MG capsule, , Disp: , Rfl:  .  lamoTRIgine (LAMICTAL) 100 MG tablet, TAKE 1/2 TABLET DAILY FOR MOOD, Disp: , Rfl: 1 .  lithium carbonate (ESKALITH) 450 MG CR tablet, TAKE 1 TABLET BY MOUTH TWICE DAILY FOR MOOD, Disp: , Rfl: 1 .  propranolol (INDERAL) 10 MG tablet, , Disp: , Rfl:    Allergies  Allergen Reactions  . Wellbutrin [Bupropion] Other (See Comments)    seizures     Review of Systems  Constitutional: Negative for fatigue and fever.  Respiratory: Negative for cough.   Cardiovascular: Negative for chest pain, palpitations and leg swelling.  Gastrointestinal: Positive for hematochezia. Negative for anorexia, nausea and rectal pain.  Endocrine: Negative for polydipsia, polyphagia and polyuria.  Genitourinary: Negative for vaginal bleeding.  Neurological: Negative for dizziness and headaches.     Today's Vitals   08/05/18 1112  BP: 118/88  Pulse: 70  SpO2: 95%  Weight: 214 lb 9.6 oz (97.3 kg)  Height: 5\' 4"  (1.626 m)   Body mass index is 36.84 kg/m.   Objective:  Physical Exam Vitals signs reviewed.  Constitutional:      Appearance: Normal appearance. She is obese.  Cardiovascular:     Rate  and Rhythm: Normal rate and regular rhythm.     Pulses: Normal pulses.     Heart sounds: Normal heart sounds. No murmur.  Pulmonary:     Effort: Pulmonary effort is normal. No respiratory distress.     Breath sounds: Normal breath sounds. No wheezing.  Skin:    General: Skin is warm and dry.     Capillary Refill: Capillary refill takes less than 2 seconds.  Neurological:     General: No focal deficit present.     Mental Status: She is alert and oriented to person, place, and time.  Psychiatric:        Mood and Affect: Mood normal.        Behavior: Behavior normal.        Thought Content: Thought content normal.        Judgment: Judgment normal.         Assessment And Plan:     1. Vitamin D deficiency  Will check vitamin D level and  supplement as needed.     Also encouraged to spend 15 minutes in the sun daily.  - Vitamin D 1,25 Dihydroxy - CMP14 + Anion Gap  2. Bipolar 2 disorder ([place])  Chronic  Has been hospitalized in the past, being followed by Behavioral health  Will check lithium level - Lithium  3. Melena  hemocult cards given  Due to her history of IBS will refer to  - CBC with Diff - CMP14 + Anion Gap - Ambulatory referral to Gastroenterology  4. Irritable bowel syndrome without diarrhea  No medications  Diagnosed since the age of 39  5. Encounter for screening  - HIV antibody (with reflex)      Minette Brine, FNP

## 2018-08-07 LAB — CBC WITH DIFFERENTIAL/PLATELET
Basophils Absolute: 0 10*3/uL (ref 0.0–0.2)
Basos: 1 %
EOS (ABSOLUTE): 0.2 10*3/uL (ref 0.0–0.4)
EOS: 4 %
HEMATOCRIT: 39.3 % (ref 34.0–46.6)
HEMOGLOBIN: 12.1 g/dL (ref 11.1–15.9)
Immature Grans (Abs): 0 10*3/uL (ref 0.0–0.1)
Immature Granulocytes: 0 %
Lymphocytes Absolute: 1.7 10*3/uL (ref 0.7–3.1)
Lymphs: 39 %
MCH: 26.6 pg (ref 26.6–33.0)
MCHC: 30.8 g/dL — AB (ref 31.5–35.7)
MCV: 86 fL (ref 79–97)
MONOCYTES: 7 %
Monocytes Absolute: 0.3 10*3/uL (ref 0.1–0.9)
Neutrophils Absolute: 2.2 10*3/uL (ref 1.4–7.0)
Neutrophils: 49 %
Platelets: 315 10*3/uL (ref 150–450)
RBC: 4.55 x10E6/uL (ref 3.77–5.28)
RDW: 13 % (ref 11.7–15.4)
WBC: 4.5 10*3/uL (ref 3.4–10.8)

## 2018-08-07 LAB — VITAMIN D 25 HYDROXY (VIT D DEFICIENCY, FRACTURES): Vit D, 25-Hydroxy: 12.6 ng/mL — ABNORMAL LOW (ref 30.0–100.0)

## 2018-08-07 LAB — CMP14 + ANION GAP
ALBUMIN: 4.6 g/dL (ref 3.8–4.9)
ALT: 11 IU/L (ref 0–32)
ANION GAP: 17 mmol/L (ref 10.0–18.0)
AST: 17 IU/L (ref 0–40)
Albumin/Globulin Ratio: 1.5 (ref 1.2–2.2)
Alkaline Phosphatase: 109 IU/L (ref 39–117)
BILIRUBIN TOTAL: 0.5 mg/dL (ref 0.0–1.2)
BUN / CREAT RATIO: 5 — AB (ref 9–23)
BUN: 6 mg/dL (ref 6–24)
CHLORIDE: 103 mmol/L (ref 96–106)
CO2: 21 mmol/L (ref 20–29)
Calcium: 10.3 mg/dL — ABNORMAL HIGH (ref 8.7–10.2)
Creatinine, Ser: 1.13 mg/dL — ABNORMAL HIGH (ref 0.57–1.00)
GFR calc Af Amer: 64 mL/min/{1.73_m2} (ref >59)
GFR calc non Af Amer: 56 mL/min/{1.73_m2} — ABNORMAL LOW (ref >59)
Globulin, Total: 3.1 g/dL (ref 1.5–4.5)
Glucose: 84 mg/dL (ref 65–99)
POTASSIUM: 4.8 mmol/L (ref 3.5–5.2)
Sodium: 141 mmol/L (ref 134–144)
TOTAL PROTEIN: 7.7 g/dL (ref 6.0–8.5)

## 2018-08-07 LAB — LITHIUM LEVEL: Lithium Lvl: 0.8 mmol/L (ref 0.6–1.2)

## 2018-08-07 LAB — HIV ANTIBODY (ROUTINE TESTING W REFLEX): HIV Screen 4th Generation wRfx: NONREACTIVE

## 2018-08-12 ENCOUNTER — Encounter: Payer: Self-pay | Admitting: Nurse Practitioner

## 2018-08-14 ENCOUNTER — Encounter: Payer: Self-pay | Admitting: Nurse Practitioner

## 2018-11-06 ENCOUNTER — Encounter: Payer: Managed Care, Other (non HMO) | Admitting: Nurse Practitioner

## 2018-12-09 ENCOUNTER — Encounter: Payer: Managed Care, Other (non HMO) | Admitting: Nurse Practitioner

## 2018-12-17 ENCOUNTER — Other Ambulatory Visit (HOSPITAL_COMMUNITY)
Admission: RE | Admit: 2018-12-17 | Discharge: 2018-12-17 | Disposition: A | Discharge status: Home / Self Care | Payer: Managed Care, Other (non HMO) | Source: Ambulatory Visit | Attending: Nurse Practitioner | Admitting: Nurse Practitioner

## 2018-12-17 ENCOUNTER — Encounter: Payer: Self-pay | Admitting: Nurse Practitioner

## 2018-12-17 ENCOUNTER — Other Ambulatory Visit: Payer: Self-pay

## 2018-12-17 ENCOUNTER — Ambulatory Visit (INDEPENDENT_AMBULATORY_CARE_PROVIDER_SITE_OTHER): Payer: Managed Care, Other (non HMO) | Admitting: Nurse Practitioner

## 2018-12-17 VITALS — BP 112/78 | HR 66 | Temp 98.5°F | Ht 64.2 in | Wt 215.8 lb

## 2018-12-17 DIAGNOSIS — Z1239 Encounter for other screening for malignant neoplasm of breast: Secondary | ICD-10-CM

## 2018-12-17 DIAGNOSIS — F3181 Bipolar II disorder: Secondary | ICD-10-CM

## 2018-12-17 DIAGNOSIS — N760 Acute vaginitis: Secondary | ICD-10-CM | POA: Diagnosis not present

## 2018-12-17 DIAGNOSIS — E669 Obesity, unspecified: Secondary | ICD-10-CM

## 2018-12-17 DIAGNOSIS — Z1211 Encounter for screening for malignant neoplasm of colon: Secondary | ICD-10-CM

## 2018-12-17 DIAGNOSIS — Z0000 Encounter for general adult medical examination without abnormal findings: Secondary | ICD-10-CM

## 2018-12-17 DIAGNOSIS — E559 Vitamin D deficiency, unspecified: Secondary | ICD-10-CM

## 2018-12-17 DIAGNOSIS — K589 Irritable bowel syndrome without diarrhea: Secondary | ICD-10-CM

## 2018-12-17 LAB — POCT URINALYSIS DIPSTICK
Bilirubin, UA: NEGATIVE
Glucose, UA: NEGATIVE
Ketones, UA: NEGATIVE
Leukocytes, UA: NEGATIVE
Nitrite, UA: NEGATIVE
Protein, UA: NEGATIVE
Spec Grav, UA: 1.02 (ref 1.010–1.025)
Urobilinogen, UA: 0.2 E.U./dL
pH, UA: 6 (ref 5.0–8.0)

## 2018-12-17 MED ORDER — FLUCONAZOLE 150 MG PO TABS: ORAL_TABLET | ORAL | 0 refills | Status: DC

## 2018-12-17 NOTE — Progress Notes (Addendum)
Subjective:     Patient ID: Carmen Kim , female    DOB: December 20, 1965 , 53 y.o.   MRN: [id number]   Chief Complaint  Patient presents with  . Annual Exam    with pap   The patient states she uses none for birth control. Last LMP was Patient's last menstrual period was 04/19/2014 (exact date).. Negative for Dysmenorrhea and Negative for Menorrhagia Mammogram last done 03/09/2018.  Negative for: breast discharge, breast lump(s), breast pain and breast self exam.  Pertinent negatives include abnormal bleeding (hematology), anxiety, decreased libido, depression, difficulty falling sleep, dyspareunia, history of infertility, nocturia, sexual dysfunction, sleep disturbances, urinary incontinence, urinary urgency, vaginal discharge and vaginal itching. Diet regular, eats increased amounts of sugar.The patient states her exercise level is  minimal.      The patient's tobacco use is:  Social History   Tobacco Use  Smoking Status Never Smoker  Smokeless Tobacco Never Used  . She has been exposed to passive smoke. The patient's alcohol use is:  Social History   Substance and Sexual Activity  Alcohol Use Yes  . Alcohol/week: 1.0 standard drinks  . Types: 1 Glasses of wine per week   Additional information: Last pap 2019, next one scheduled for today.  HPI  Here for HM  She is currently working from home.   Wt Readings from Last 3 Encounters: 12/17/18 : 215 lb 12.8 oz (97.9 kg) 08/05/18 : 214 lb 9.6 oz (97.3 kg)  She goes to Dr. Darleene Cleaver for bipolar and depression Continues to see a therapist.   She lives alone  08/23/17 : 235 lb (106.6 kg)  Vaginal Discharge The patient's primary symptoms include vaginal discharge. The patient's pertinent negatives include no vaginal bleeding. The current episode started in the past 7 days. The problem occurs constantly. The problem affects both sides. She is not pregnant. Pertinent negatives include no abdominal pain, dysuria or headaches. The vaginal  discharge was thick and white. She has not been passing clots. She has not been passing tissue. Nothing aggravates the symptoms. The treatment provided no relief. She is not sexually active. She uses nothing for contraception. She is postmenopausal.     Past Medical History:  Diagnosis Date  . Anemia   . Bipolar affect, depressed ([place])   . Depressed   . IBS (irritable bowel syndrome)      Family History  Problem Relation Age of Onset  . Diabetes Mother   . Hypertension Mother   . Cerebral aneurysm Mother      Current Outpatient Medications:  .  clonazePAM (KLONOPIN) 1 MG tablet, Take 1 mg by mouth 2 (two) times daily. One at bedtime as needed for sleep, Disp: , Rfl:  .  FLUoxetine (PROZAC) 40 MG capsule, , Disp: , Rfl:  .  lamoTRIgine (LAMICTAL) 100 MG tablet, 100 mg 2 (two) times daily. , Disp: , Rfl: 1 .  lithium carbonate (ESKALITH) 450 MG CR tablet, Take by mouth daily at 2 am. , Disp: , Rfl: 1 .  propranolol (INDERAL) 20 MG tablet, Take 20 mg by mouth daily at 2 am., Disp: , Rfl:    Allergies  Allergen Reactions  . Wellbutrin [Bupropion] Other (See Comments)    seizures     Review of Systems  Constitutional: Negative.   HENT: Negative.   Eyes: Negative.   Respiratory: Negative.  Negative for cough.   Cardiovascular: Negative.   Gastrointestinal: Negative.  Negative for abdominal pain.  Endocrine: Negative.   Genitourinary: Positive for vaginal  discharge. Negative for dysuria.  Musculoskeletal: Negative.   Skin: Negative.   Allergic/Immunologic: Negative.   Neurological: Negative.  Negative for dizziness and headaches.  Hematological: Negative.   Psychiatric/Behavioral: Negative.      Today's Vitals   12/17/18 1422  BP: 112/78  Pulse: 66  Temp: 98.5 F (36.9 C)  TempSrc: Oral  Weight: 215 lb 12.8 oz (97.9 kg)  Height: 5' 4.2" (1.631 m)   Body mass index is 36.81 kg/m.   Objective:  Physical Exam Vitals signs reviewed.  Constitutional:       Appearance: Normal appearance.  Cardiovascular:     Rate and Rhythm: Normal rate and regular rhythm.     Pulses: Normal pulses.     Heart sounds: Normal heart sounds. No murmur.  Pulmonary:     Effort: Pulmonary effort is normal.     Breath sounds: Normal breath sounds.  Chest:     Chest wall: No mass.     Breasts:        Right: Normal. No mass, nipple discharge or tenderness.        Left: Normal. No mass, nipple discharge or tenderness.  Genitourinary:    Vagina: Normal.     Cervix: Dilated. Discharge (white slightly thick discharge present) present.     Uterus: Normal.      Adnexa: Right adnexa normal and left adnexa normal.  Musculoskeletal: Normal range of motion.  Lymphadenopathy:     Upper Body:     Right upper body: No supraclavicular, axillary or pectoral adenopathy.     Left upper body: No supraclavicular, axillary or pectoral adenopathy.  Skin:    General: Skin is warm and dry.  Neurological:     Mental Status: She is alert.         Assessment And Plan:     1. Encounter for screening for malignant neoplasm of breast  Pt instructed on Self Breast Exam.According to ACOG guidelines Women aged 72 and older are recommended to get an annual mammogram. Form completed and given to patient contact the [facility] for appointment scheduing.   Pt encouraged to get annual mammogram - MM Digital Screening; Future - Cytology -Pap Smear - POCT Urinalysis Dipstick (81002)  2. Vitamin D deficiency  Will check vitamin D level and supplement as needed.     Also encouraged to spend 15 minutes in the sun daily.  - Vitamin D (25 hydroxy) - Vitamin D, Ergocalciferol, (DRISDOL) 1.25 MG (50000 UT) CAPS capsule; Take 1 capsule (50,000 Units total) by mouth 2 (two) times a week.  Dispense: 24 capsule; Refill: 0  3. Bipolar 2 disorder ([facility])  Chronic, stable  She is followed by behavioral health  4. Irritable bowel syndrome without diarrhea  Chronic, stable   No recent  exacerbations  5. Encounter for screening colonoscopy  According to USPTF Colorectal cancer Screening guidelines. Colonoscopy is recommended every 10 years, starting at age 16years.  Will refer to GI for colon cancer screening.  6. Obesity (BMI 30-39.9)  Chronic  Discussed healthy diet and regular exercise options   Encouraged to exercise at least 150 minutes per week with 2 days of strength training increasing slowly  Encouraged her to limit her intake of white starches - CMP14 + Anion Gap - CBC no Diff - Hemoglobin A1c  7. Acute vaginitis  White discharge present to cervical area.  Will treat with diflucan and send specimen to check for GC, yeast, trichomonas and bacterial vaginosis - fluconazole (DIFLUCAN) 150 MG tablet;  Take one tablet by mouth now then repeat in 5 days  Dispense: 2 tablet; Refill: 0  8. Health maintenance examination . Behavior modifications discussed and diet history reviewed.   . Pt will continue to exercise regularly and modify diet with low GI, plant based foods and decrease intake of processed foods.  . Recommend intake of daily multivitamin, Vitamin D, and calcium.  . Recommend mammogram and colonoscopy for preventive screenings, as well as recommend immunizations that include influenza, TDAP   Minette Brine, FNP    THE PATIENT IS ENCOURAGED TO PRACTICE SOCIAL DISTANCING DUE TO THE COVID-19 PANDEMIC.

## 2018-12-18 ENCOUNTER — Encounter: Payer: Self-pay | Admitting: Internal Medicine

## 2018-12-18 LAB — CMP14 + ANION GAP
ALT: 11 IU/L (ref 0–32)
AST: 19 IU/L (ref 0–40)
Albumin/Globulin Ratio: 1.4 (ref 1.2–2.2)
Albumin: 4.4 g/dL (ref 3.8–4.9)
Alkaline Phosphatase: 109 IU/L (ref 39–117)
Anion Gap: 15 mmol/L (ref 10.0–18.0)
BUN/Creatinine Ratio: 14 (ref 9–23)
BUN: 14 mg/dL (ref 6–24)
Bilirubin Total: 0.7 mg/dL (ref 0.0–1.2)
CO2: 20 mmol/L (ref 20–29)
Calcium: 10 mg/dL (ref 8.7–10.2)
Chloride: 102 mmol/L (ref 96–106)
Creatinine, Ser: 1.01 mg/dL — ABNORMAL HIGH (ref 0.57–1.00)
GFR calc Af Amer: 73 mL/min/{1.73_m2} (ref >59)
GFR calc non Af Amer: 64 mL/min/{1.73_m2} (ref >59)
Globulin, Total: 3.2 g/dL (ref 1.5–4.5)
Glucose: 84 mg/dL (ref 65–99)
Potassium: 4 mmol/L (ref 3.5–5.2)
Sodium: 137 mmol/L (ref 134–144)
Total Protein: 7.6 g/dL (ref 6.0–8.5)

## 2018-12-18 LAB — HEMOGLOBIN A1C
Est. average glucose Bld gHb Est-mCnc: 105 mg/dL
Hgb A1c MFr Bld: 5.3 % (ref 4.8–5.6)

## 2018-12-18 LAB — CBC
Hematocrit: 38 % (ref 34.0–46.6)
Hemoglobin: 12 g/dL (ref 11.1–15.9)
MCH: 26 pg — ABNORMAL LOW (ref 26.6–33.0)
MCHC: 31.6 g/dL (ref 31.5–35.7)
MCV: 82 fL (ref 79–97)
Platelets: 300 10*3/uL (ref 150–450)
RBC: 4.61 x10E6/uL (ref 3.77–5.28)
RDW: 13.2 % (ref 11.7–15.4)
WBC: 5.3 10*3/uL (ref 3.4–10.8)

## 2018-12-18 LAB — VITAMIN D 25 HYDROXY (VIT D DEFICIENCY, FRACTURES): Vit D, 25-Hydroxy: 13.5 ng/mL — ABNORMAL LOW (ref 30.0–100.0)

## 2018-12-19 LAB — CYTOLOGY - PAP: Diagnosis: NEGATIVE

## 2018-12-19 MED ORDER — VITAMIN D (ERGOCALCIFEROL) 1.25 MG (50000 UNIT) PO CAPS: 50000.0000 [IU] | ORAL_CAPSULE | ORAL | 0 refills | Status: DC

## 2018-12-28 ENCOUNTER — Encounter: Payer: Self-pay | Admitting: Nurse Practitioner

## 2019-01-13 ENCOUNTER — Ambulatory Visit (AMBULATORY_SURGERY_CENTER): Payer: Self-pay | Admitting: *Deleted

## 2019-01-13 ENCOUNTER — Other Ambulatory Visit: Payer: Self-pay

## 2019-01-13 VITALS — Temp 94.1°F | Ht 64.0 in | Wt 221.0 lb

## 2019-01-13 DIAGNOSIS — Z1211 Encounter for screening for malignant neoplasm of colon: Secondary | ICD-10-CM

## 2019-01-13 DIAGNOSIS — K921 Melena: Secondary | ICD-10-CM

## 2019-01-13 MED ORDER — NA SULFATE-K SULFATE-MG SULF 17.5-3.13-1.6 GM/177ML PO SOLN: ORAL | 0 refills | Status: DC

## 2019-01-13 NOTE — Progress Notes (Signed)
Patient is here for PV Suprep $15 off  Coupon included. Patient understands to call us back with any questions or concerns. Patient denies any allergies to eggs or soy. Patient denies any problems with anesthesia/sedation. Patient denies any oxygen use at home. 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  EMMI education assisgned to patient on colonoscopy, this was explained and instructions given to patient.Pt is aware that care partner will wait in the car during procedure; if they feel like they will be too hot to wait in the car; they may wait in the lobby.  We want them to wear a mask (we do not have any that we can provide them), practice social distancing, and we will check their temperatures when they get here.  I did remind patient that their care partner needs to stay in the parking lot the entire time. Pt will wear mask into building.

## 2019-01-20 ENCOUNTER — Encounter: Payer: Self-pay | Admitting: Nurse Practitioner

## 2019-01-22 ENCOUNTER — Encounter: Payer: Self-pay | Admitting: Internal Medicine

## 2019-01-24 ENCOUNTER — Telehealth: Payer: Self-pay | Admitting: Internal Medicine

## 2019-01-24 NOTE — Telephone Encounter (Signed)

## 2019-01-27 ENCOUNTER — Encounter: Payer: Self-pay | Admitting: Internal Medicine

## 2019-01-27 ENCOUNTER — Ambulatory Visit (AMBULATORY_SURGERY_CENTER): Payer: Managed Care, Other (non HMO) | Admitting: Internal Medicine

## 2019-01-27 ENCOUNTER — Other Ambulatory Visit: Payer: Self-pay

## 2019-01-27 VITALS — BP 140/96 | HR 61 | Temp 97.1°F | Resp 13 | Ht 64.0 in | Wt 215.0 lb

## 2019-01-27 DIAGNOSIS — Z1211 Encounter for screening for malignant neoplasm of colon: Secondary | ICD-10-CM

## 2019-01-27 DIAGNOSIS — D123 Benign neoplasm of transverse colon: Secondary | ICD-10-CM | POA: Diagnosis not present

## 2019-01-27 MED ORDER — SODIUM CHLORIDE 0.9 % IV SOLN: 500.0000 mL | Freq: Once | INTRAVENOUS | Status: DC

## 2019-01-27 NOTE — Progress Notes (Signed)
No egg or soy allergy   I have reviewed the patient's medical history in detail and updated the computerized patient record.  jb temp cw vitals

## 2019-01-27 NOTE — Progress Notes (Signed)
A and O x3. Report to RN. Tolerated MAC anesthesia well.

## 2019-01-27 NOTE — Progress Notes (Signed)
Called to room to assist during endoscopic procedure.  Patient ID and intended procedure confirmed with present staff. Received instructions for my participation in the procedure from the performing physician.  

## 2019-01-27 NOTE — Patient Instructions (Signed)
Information on polyps, diverticulosis and hemorrhoids given to you today.  Await pathology results.  YOU HAD AN ENDOSCOPIC PROCEDURE TODAY AT THE [facility]:   Refer to the procedure report that was given to you for any specific questions about what was found during the examination.  If the procedure report does not answer your questions, please call your gastroenterologist to clarify.  If you requested that your care partner not be given the details of your procedure findings, then the procedure report has been included in a sealed envelope for you to review at your convenience later.  YOU SHOULD EXPECT: Some feelings of bloating in the abdomen. Passage of more gas than usual.  Walking can help get rid of the air that was put into your GI tract during the procedure and reduce the bloating. If you had a lower endoscopy (such as a colonoscopy or flexible sigmoidoscopy) you may notice spotting of blood in your stool or on the toilet paper. If you underwent a bowel prep for your procedure, you may not have a normal bowel movement for a few days.  Please Note:  You might notice some irritation and congestion in your nose or some drainage.  This is from the oxygen used during your procedure.  There is no need for concern and it should clear up in a day or so.  SYMPTOMS TO REPORT IMMEDIATELY:   Following lower endoscopy (colonoscopy or flexible sigmoidoscopy):  Excessive amounts of blood in the stool  Significant tenderness or worsening of abdominal pains  Swelling of the abdomen that is new, acute  Fever of 100F or higher   For urgent or emergent issues, a gastroenterologist can be reached at any hour by calling [phone].   DIET:  We do recommend a small meal at first, but then you may proceed to your regular diet.  Drink plenty of fluids but you should avoid alcoholic beverages for 24 hours.  ACTIVITY:  You should plan to take it easy for the rest of today and you should NOT  DRIVE or use heavy machinery until tomorrow (because of the sedation medicines used during the test).    FOLLOW UP: Our staff will call the number listed on your records 48-72 hours following your procedure to check on you and address any questions or concerns that you may have regarding the information given to you following your procedure. If we do not reach you, we will leave a message.  We will attempt to reach you two times.  During this call, we will ask if you have developed any symptoms of COVID 19. If you develop any symptoms (ie: fever, flu-like symptoms, shortness of breath, cough etc.) before then, please call [phone].  If you test positive for Covid 19 in the 2 weeks post procedure, please call and report this information to us.    If any biopsies were taken you will be contacted by phone or by letter within the next 1-3 weeks.  Please call us at [phone] if you have not heard about the biopsies in 3 weeks.    SIGNATURES/CONFIDENTIALITY: You and/or your care partner have signed paperwork which will be entered into your electronic medical record.  These signatures attest to the fact that that the information above on your After Visit Summary has been reviewed and is understood.  Full responsibility of the confidentiality of this discharge information lies with you and/or your care-partner. 

## 2019-01-27 NOTE — Op Note (Signed)
[facility] Patient Name: Carmen Kim Procedure Date: 01/27/2019 3:19 PM MRN: [id number] Endoscopist: Jerene Bears , MD Age: 53 Referring MD:  Date of Birth: 1965/10/05 Gender: Female Account #: [id number] Procedure:                Colonoscopy Indications:              Screening for colorectal malignant neoplasm, This                            is the patient's first colonoscopy Medicines:                Monitored Anesthesia Care Procedure:                Pre-Anesthesia Assessment:                           - Prior to the procedure, a History and Physical                            was performed, and patient medications and                            allergies were reviewed. The patient's tolerance of                            previous anesthesia was also reviewed. The risks                            and benefits of the procedure and the sedation                            options and risks were discussed with the patient.                            All questions were answered, and informed consent                            was obtained. Prior Anticoagulants: The patient has                            taken no previous anticoagulant or antiplatelet                            agents. ASA Grade Assessment: II - A patient with                            mild systemic disease. After reviewing the risks                            and benefits, the patient was deemed in                            satisfactory condition to undergo the procedure.  After obtaining informed consent, the colonoscope                            was passed under direct vision. Throughout the                            procedure, the patient's blood pressure, pulse, and                            oxygen saturations were monitored continuously. The                            Colonoscope was introduced through the anus and                            advanced to the cecum,  identified by appendiceal                            orifice and ileocecal valve. The colonoscopy was                            performed without difficulty. The patient tolerated                            the procedure well. The quality of the bowel                            preparation was good. The ileocecal valve,                            appendiceal orifice, and rectum were photographed. Scope In: 3:21:09 PM Scope Out: 3:36:32 PM Scope Withdrawal Time: 0 hours 12 minutes 23 seconds  Total Procedure Duration: 0 hours 15 minutes 23 seconds  Findings:                 The digital rectal exam was normal.                           A 5 mm polyp was found in the hepatic flexure. The                            polyp was sessile. The polyp was removed with a                            cold snare. Resection and retrieval were complete.                           Two sessile polyps were found in the transverse                            colon. The polyps were 2 to 3 mm in size. These  polyps were removed with a cold snare. Resection                            and retrieval were complete.                           Multiple small-mouthed diverticula were found in                            the sigmoid colon, descending colon and hepatic                            flexure.                           Internal hemorrhoids were found during                            retroflexion. The hemorrhoids were small. Complications:            No immediate complications. Estimated Blood Loss:     Estimated blood loss was minimal. Impression:               - One 5 mm polyp at the hepatic flexure, removed                            with a cold snare. Resected and retrieved.                           - Two 2 to 3 mm polyps in the transverse colon,                            removed with a cold snare. Resected and retrieved.                           - Moderate diverticulosis in the  sigmoid colon and                            in the descending colon.                           - Internal hemorrhoids. Recommendation:           - Patient has a contact number available for                            emergencies. The signs and symptoms of potential                            delayed complications were discussed with the                            patient. Return to normal activities tomorrow.                            Written discharge instructions were provided  to the                            patient.                           - Resume previous diet.                           - Continue present medications.                           - Await pathology results.                           - Repeat colonoscopy is recommended for                            surveillance. The colonoscopy date will be                            determined after pathology results from today's                            exam become available for review. Jerene Bears, MD 01/27/2019 3:41:07 PM This report has been signed electronically.

## 2019-01-29 ENCOUNTER — Telehealth: Payer: Self-pay | Admitting: *Deleted

## 2019-01-29 NOTE — Telephone Encounter (Signed)
First follow up call attempt.  Reached voicemail with phone number identified.  Message left to call if any questions or concerns regarding procedure or COVID19 

## 2019-02-13 ENCOUNTER — Encounter: Payer: Self-pay | Admitting: Internal Medicine

## 2019-02-23 ENCOUNTER — Other Ambulatory Visit: Payer: Self-pay | Admitting: Nurse Practitioner

## 2019-02-23 DIAGNOSIS — E559 Vitamin D deficiency, unspecified: Secondary | ICD-10-CM

## 2019-05-22 ENCOUNTER — Other Ambulatory Visit: Payer: Self-pay | Admitting: Nurse Practitioner

## 2019-05-22 DIAGNOSIS — E559 Vitamin D deficiency, unspecified: Secondary | ICD-10-CM

## 2019-08-14 ENCOUNTER — Other Ambulatory Visit: Payer: Self-pay | Admitting: Nurse Practitioner

## 2019-08-14 DIAGNOSIS — E559 Vitamin D deficiency, unspecified: Secondary | ICD-10-CM

## 2019-12-24 ENCOUNTER — Encounter: Payer: Self-pay | Admitting: Nurse Practitioner

## 2019-12-24 ENCOUNTER — Ambulatory Visit (INDEPENDENT_AMBULATORY_CARE_PROVIDER_SITE_OTHER): Payer: Managed Care, Other (non HMO) | Admitting: Nurse Practitioner

## 2019-12-24 ENCOUNTER — Other Ambulatory Visit: Payer: Self-pay

## 2019-12-24 VITALS — BP 150/98 | HR 77 | Temp 98.1°F | Ht 64.2 in | Wt 234.6 lb

## 2019-12-24 DIAGNOSIS — N3943 Post-void dribbling: Secondary | ICD-10-CM

## 2019-12-24 DIAGNOSIS — Z8249 Family history of ischemic heart disease and other diseases of the circulatory system: Secondary | ICD-10-CM

## 2019-12-24 DIAGNOSIS — E559 Vitamin D deficiency, unspecified: Secondary | ICD-10-CM | POA: Diagnosis not present

## 2019-12-24 DIAGNOSIS — Z1159 Encounter for screening for other viral diseases: Secondary | ICD-10-CM

## 2019-12-24 DIAGNOSIS — I10 Essential (primary) hypertension: Secondary | ICD-10-CM | POA: Diagnosis not present

## 2019-12-24 DIAGNOSIS — Z1231 Encounter for screening mammogram for malignant neoplasm of breast: Secondary | ICD-10-CM

## 2019-12-24 DIAGNOSIS — Z7982 Long term (current) use of aspirin: Secondary | ICD-10-CM

## 2019-12-24 DIAGNOSIS — Z0000 Encounter for general adult medical examination without abnormal findings: Secondary | ICD-10-CM | POA: Diagnosis not present

## 2019-12-24 DIAGNOSIS — F3181 Bipolar II disorder: Secondary | ICD-10-CM

## 2019-12-24 DIAGNOSIS — Z9181 History of falling: Secondary | ICD-10-CM

## 2019-12-24 LAB — POCT URINALYSIS DIPSTICK
Bilirubin, UA: NEGATIVE
Glucose, UA: NEGATIVE
Ketones, UA: NEGATIVE
Leukocytes, UA: NEGATIVE
Nitrite, UA: NEGATIVE
Protein, UA: NEGATIVE
Spec Grav, UA: 1.025 (ref 1.010–1.025)
Urobilinogen, UA: 0.2 E.U./dL
pH, UA: 5.5 (ref 5.0–8.0)

## 2019-12-24 LAB — POCT UA - MICROALBUMIN
Albumin/Creatinine Ratio, Urine, POC: 30
Creatinine, POC: 200 mg/dL
Microalbumin Ur, POC: 10 mg/L

## 2019-12-24 NOTE — Patient Instructions (Signed)
Health Maintenance, Female Adopting a healthy lifestyle and getting preventive care are important in promoting health and wellness. Ask your health care provider about:  The right schedule for you to have regular tests and exams.  Things you can do on your own to prevent diseases and keep yourself healthy. What should I know about diet, weight, and exercise? Eat a healthy diet   Eat a diet that includes plenty of vegetables, fruits, low-fat dairy products, and lean protein.  Do not eat a lot of foods that are high in solid fats, added sugars, or sodium. Maintain a healthy weight Body mass index (BMI) is used to identify weight problems. It estimates body fat based on height and weight. Your health care provider can help determine your BMI and help you achieve or maintain a healthy weight. Get regular exercise Get regular exercise. This is one of the most important things you can do for your health. Most adults should:  Exercise for at least 150 minutes each week. The exercise should increase your heart rate and make you sweat (moderate-intensity exercise).  Do strengthening exercises at least twice a week. This is in addition to the moderate-intensity exercise.  Spend less time sitting. Even light physical activity can be beneficial. Watch cholesterol and blood lipids Have your blood tested for lipids and cholesterol at 54 years of age, then have this test every 5 years. Have your cholesterol levels checked more often if:  Your lipid or cholesterol levels are high.  You are older than 54 years of age.  You are at high risk for heart disease. What should I know about cancer screening? Depending on your health history and family history, you may need to have cancer screening at various ages. This may include screening for:  Breast cancer.  Cervical cancer.  Colorectal cancer.  Skin cancer.  Lung cancer. What should I know about heart disease, diabetes, and high blood  pressure? Blood pressure and heart disease  High blood pressure causes heart disease and increases the risk of stroke. This is more likely to develop in people who have high blood pressure readings, are of African descent, or are overweight.  Have your blood pressure checked: ? Every 3-5 years if you are 18-39 years of age. ? Every year if you are 40 years old or older. Diabetes Have regular diabetes screenings. This checks your fasting blood sugar level. Have the screening done:  Once every three years after age 40 if you are at a normal weight and have a low risk for diabetes.  More often and at a younger age if you are overweight or have a high risk for diabetes. What should I know about preventing infection? Hepatitis B If you have a higher risk for hepatitis B, you should be screened for this virus. Talk with your health care provider to find out if you are at risk for hepatitis B infection. Hepatitis C Testing is recommended for:  Everyone born from 1945 through 1965.  Anyone with known risk factors for hepatitis C. Sexually transmitted infections (STIs)  Get screened for STIs, including gonorrhea and chlamydia, if: ? You are sexually active and are younger than 54 years of age. ? You are older than 54 years of age and your health care provider tells you that you are at risk for this type of infection. ? Your sexual activity has changed since you were last screened, and you are at increased risk for chlamydia or gonorrhea. Ask your health care provider if   you are at risk.  Ask your health care provider about whether you are at high risk for HIV. Your health care provider may recommend a prescription medicine to help prevent HIV infection. If you choose to take medicine to prevent HIV, you should first get tested for HIV. You should then be tested every 3 months for as long as you are taking the medicine. Pregnancy  If you are about to stop having your period (premenopausal) and  you may become pregnant, seek counseling before you get pregnant.  Take 400 to 800 micrograms (mcg) of folic acid every day if you become pregnant.  Ask for birth control (contraception) if you want to prevent pregnancy. Osteoporosis and menopause Osteoporosis is a disease in which the bones lose minerals and strength with aging. This can result in bone fractures. If you are 65 years old or older, or if you are at risk for osteoporosis and fractures, ask your health care provider if you should:  Be screened for bone loss.  Take a calcium or vitamin D supplement to lower your risk of fractures.  Be given hormone replacement therapy (HRT) to treat symptoms of menopause. Follow these instructions at home: Lifestyle  Do not use any products that contain nicotine or tobacco, such as cigarettes, e-cigarettes, and chewing tobacco. If you need help quitting, ask your health care provider.  Do not use street drugs.  Do not share needles.  Ask your health care provider for help if you need support or information about quitting drugs. Alcohol use  Do not drink alcohol if: ? Your health care provider tells you not to drink. ? You are pregnant, may be pregnant, or are planning to become pregnant.  If you drink alcohol: ? Limit how much you use to 0-1 drink a day. ? Limit intake if you are breastfeeding.  Be aware of how much alcohol is in your drink. In the [place] one drink equals one 12 oz bottle of beer (355 mL), one 5 oz glass of wine (148 mL), or one 1 oz glass of hard liquor (44 mL). General instructions  Schedule regular health, dental, and eye exams.  Stay current with your vaccines.  Tell your health care provider if: ? You often feel depressed. ? You have ever been abused or do not feel safe at home. Summary  Adopting a healthy lifestyle and getting preventive care are important in promoting health and wellness.  Follow your health care provider's instructions about healthy  diet, exercising, and getting tested or screened for diseases.  Follow your health care provider's instructions on monitoring your cholesterol and blood pressure. This information is not intended to replace advice given to you by your health care provider. Make sure you discuss any questions you have with your health care provider. Document Revised: 05/15/2018 Document Reviewed: 05/15/2018 [employer] Patient Education  2020 [employer]  

## 2019-12-24 NOTE — Progress Notes (Addendum)
This visit occurred during the SARS-CoV-2 public health emergency.  Safety protocols were in place, including screening questions prior to the visit, additional usage of staff PPE, and extensive cleaning of exam room while observing appropriate contact time as indicated for disinfecting solutions.  Subjective:     Patient ID: Carmen Kim , female    DOB: 04-21-1966 , 54 y.o.   MRN: [id number]   Chief Complaint  Patient presents with  . Annual Exam    HPI  Presents today for annual HM exam. She is doing well but states that she has fallen a few times earlier this year. She reports no LOS or head trauma. She is not dizzy or trip over anything. She does report her mother passed away at the age of 68 with a brain aneurysm. She is current on her eye exam and the dentist. She does report having a colonoscopy and does have some bleeding that is hemorrhoids related.    She reports eating pretty healthy and drinks around six glasses of water per day. She is also having difficulty with bladder leakage and emptying her bladder. She was exercising back in the winter but now only does stretches and was encouraged to exercise in the early morning or at night. She is sleeping well at .  Wt Readings from Last 3 Encounters: 12/24/19 : 234 lb 9.6 oz (106.4 kg) 01/27/19 : 215 lb (97.5 kg) 01/13/19 : 221 lb (100.2 kg)     Past Medical History:  Diagnosis Date  . Anemia   . Bipolar affect, depressed ([place])   . Depressed   . Hemorrhoids   . IBS (irritable bowel syndrome)   . Seizures ([place]) 2008   from Wellbutrin only     Family History  Problem Relation Age of Onset  . Diabetes Mother   . Hypertension Mother   . Cerebral aneurysm Mother   . Colon cancer Neg Hx   . Esophageal cancer Neg Hx   . Rectal cancer Neg Hx   . Stomach cancer Neg Hx   . Colon polyps Neg Hx      Current Outpatient Medications:  .  aspirin-acetaminophen-caffeine (EXCEDRIN MIGRAINE) 250-250-65 MG tablet, Take by mouth  every 6 (six) hours as needed for headache., Disp: , Rfl:  .  clonazePAM (KLONOPIN) 1 MG tablet, Take 1 mg by mouth 2 (two) times daily as needed. One at bedtime as needed for sleep , Disp: , Rfl:  .  FLUoxetine (PROZAC) 40 MG capsule, , Disp: , Rfl:  .  lamoTRIgine (LAMICTAL) 100 MG tablet, 100 mg 2 (two) times daily. , Disp: , Rfl: 1 .  lurasidone (LATUDA) 80 MG TABS tablet, Take 80 mg by mouth daily with breakfast., Disp: , Rfl:  .  propranolol (INDERAL) 20 MG tablet, Take 20 mg by mouth daily at 2 am., Disp: , Rfl:  .  Vitamin D, Ergocalciferol, (DRISDOL) 1.25 MG (50000 UNIT) CAPS capsule, TAKE 1 CAPSULE (50,000 UNITS TOTAL) BY MOUTH 2 (TWO) TIMES A WEEK., Disp: 24 capsule, Rfl: 0   Allergies  Allergen Reactions  . Wellbutrin [Bupropion] Other (See Comments)    seizures      The patient states she uses none for birth control. Last LMP was Patient's last menstrual period was 04/19/2014 (exact date).. Negative for Dysmenorrhea and Negative for Menorrhagia. Negative for: breast discharge, breast lump(s), breast pain and breast self exam. Associated symptoms include abnormal vaginal bleeding. Pertinent negatives include abnormal bleeding (hematology), anxiety, decreased libido, depression, difficulty falling sleep, dyspareunia,  history of infertility, nocturia, sexual dysfunction, sleep disturbances, urinary incontinence, urinary urgency, vaginal discharge and vaginal itching. Diet regular.The patient states her exercise level is minimal.   The patient's tobacco use is:  Social History   Tobacco Use  Smoking Status Never Smoker  Smokeless Tobacco Never Used   She has been exposed to passive smoke. The patient's alcohol use is:  Social History   Substance and Sexual Activity  Alcohol Use Yes  . Alcohol/week: 1.0 standard drink  . Types: 1 Glasses of wine per week   Comment: occ   Additional information: Last pap 12/17/2018, next one scheduled for 12/16/2021    Review of Systems   Constitutional: Negative.   HENT: Negative.   Eyes: Negative.   Respiratory: Negative.   Cardiovascular: Negative.   Gastrointestinal: Negative.   Endocrine: Negative.   Genitourinary: Negative.   Musculoskeletal: Negative.   Skin: Negative.   Allergic/Immunologic: Negative.   Neurological: Negative.   Hematological: Negative.   Psychiatric/Behavioral: Negative.      Today's Vitals   12/24/19 1452  BP: (!) 150/98  Pulse: 77  Temp: 98.1 F (36.7 C)  TempSrc: Oral  Weight: 234 lb 9.6 oz (106.4 kg)  Height: 5' 4.2" (1.631 m)  PainSc: 0-No pain   Body mass index is 40.02 kg/m.   Objective:  Physical Exam Constitutional:      General: She is not in acute distress.    Appearance: Normal appearance. She is well-developed. She is obese.  HENT:     Head: Normocephalic and atraumatic.     Right Ear: Hearing, tympanic membrane, ear canal and external ear normal. There is no impacted cerumen.     Left Ear: Hearing, tympanic membrane, ear canal and external ear normal. There is no impacted cerumen.     Nose:     Comments: Deferred - masked    Mouth/Throat:     Comments: Deferred - masked Eyes:     General: Lids are normal.     Extraocular Movements: Extraocular movements intact.     Conjunctiva/sclera: Conjunctivae normal.     Pupils: Pupils are equal, round, and reactive to light.     Funduscopic exam:    Right eye: No papilledema.        Left eye: No papilledema.  Neck:     Thyroid: No thyroid mass.     Vascular: No carotid bruit.  Cardiovascular:     Rate and Rhythm: Normal rate and regular rhythm.     Pulses: Normal pulses.     Heart sounds: Normal heart sounds. No murmur heard.   Pulmonary:     Effort: Pulmonary effort is normal.     Breath sounds: Normal breath sounds.  Abdominal:     General: Abdomen is flat. Bowel sounds are normal. There is no distension.     Palpations: Abdomen is soft.     Tenderness: There is no abdominal tenderness.   Genitourinary:    Rectum: Guaiac result negative.  Musculoskeletal:        General: No swelling. Normal range of motion.     Cervical back: Full passive range of motion without pain, normal range of motion and neck supple.     Right lower leg: No edema.     Left lower leg: No edema.  Skin:    General: Skin is warm and dry.     Capillary Refill: Capillary refill takes less than 2 seconds.  Neurological:     General: No focal deficit present.  Mental Status: She is alert and oriented to person, place, and time.     Cranial Nerves: No cranial nerve deficit.     Sensory: No sensory deficit.  Psychiatric:        Mood and Affect: Mood normal.        Behavior: Behavior normal.        Thought Content: Thought content normal.        Judgment: Judgment normal.         Assessment And Plan:     1. Health maintenance examination . Behavior modifications discussed and diet history reviewed.   . Pt will continue to exercise regularly and modify diet with low GI, plant based foods and decrease intake of processed foods.  . Recommend intake of daily multivitamin, Vitamin D, and calcium.  . Recommend mammogram for preventive screenings, as well as recommend immunizations that include influenza, TDAP - CBC - CMP14+EGFR - Lipid panel  2. Encounter for screening mammogram for malignant neoplasm of breast  Pt instructed on Self Breast Exam.According to ACOG guidelines Women aged 51 and older are recommended to get an annual mammogram. Form completed and given to patient contact the [facility] for appointment scheduing.   Pt encouraged to get annual mammogram - MM DIGITAL SCREENING BILATERAL; Future  3. Encounter for hepatitis C screening test for low risk patient  Will check Hepatitis C screening due to recent recommendations to screen all adults 18 years and older - Hepatitis C antibody  4. Vitamin D deficiency  Will check vitamin D level and supplement as needed.     Also  encouraged to spend 15 minutes in the sun daily.  - VITAMIN D 25 Hydroxy (Vit-D Deficiency, Fractures)  5. Bipolar 2 disorder ([place]) Comments: - chronic, continue follow up with Behavioral Health  6. Post-void dribbling Comments: - will refer to PT for pelvic rehab at [facility] - Ambulatory referral to Physical Therapy  7. Elevated blood pressure reading in office with diagnosis of hypertension  Blood pressure is elevated today  CMP ordered to check renal function.  The importance of regular exercise and dietary modification with low salt diet was stressed to the patient.  EKG done with NSR - EKG 12-Lead - POCT Urinalysis Dipstick (81002) - POCT UA - Microalbumin  8. Personal history of falls Comments: - multiple falls over the last 6 months - negative for injuries - will check CT scan head to evaluate for structural damage - TSH - CT Head Wo Contrast; Future  9. Family history of cerebral aneurysm Comments: - Mother passed away at age 6 with a cerebral aneurysm - CT Head Wo Contrast; Future   Patient was given opportunity to ask questions. Patient verbalized understanding of the plan and was able to repeat key elements of the plan. All questions were answered to their satisfaction.   Minette Brine, FNP   I, Minette Brine, FNP, have reviewed all documentation for this visit. The documentation on 12/26/19 for the exam, diagnosis, procedures, and orders are all accurate and complete.  THE PATIENT IS ENCOURAGED TO PRACTICE SOCIAL DISTANCING DUE TO THE COVID-19 PANDEMIC.

## 2019-12-25 LAB — CMP14+EGFR
ALT: 12 IU/L (ref 0–32)
AST: 15 IU/L (ref 0–40)
Albumin/Globulin Ratio: 1.5 (ref 1.2–2.2)
Albumin: 4.6 g/dL (ref 3.8–4.9)
Alkaline Phosphatase: 116 IU/L (ref 48–121)
BUN/Creatinine Ratio: 13 (ref 9–23)
BUN: 14 mg/dL (ref 6–24)
Bilirubin Total: 0.6 mg/dL (ref 0.0–1.2)
CO2: 23 mmol/L (ref 20–29)
Calcium: 10.4 mg/dL — ABNORMAL HIGH (ref 8.7–10.2)
Chloride: 100 mmol/L (ref 96–106)
Creatinine, Ser: 1.11 mg/dL — ABNORMAL HIGH (ref 0.57–1.00)
GFR calc Af Amer: 65 mL/min/{1.73_m2} (ref >59)
GFR calc non Af Amer: 56 mL/min/{1.73_m2} — ABNORMAL LOW (ref >59)
Globulin, Total: 3.1 g/dL (ref 1.5–4.5)
Glucose: 82 mg/dL (ref 65–99)
Potassium: 3.9 mmol/L (ref 3.5–5.2)
Sodium: 135 mmol/L (ref 134–144)
Total Protein: 7.7 g/dL (ref 6.0–8.5)

## 2019-12-25 LAB — CBC
Hematocrit: 38.6 % (ref 34.0–46.6)
Hemoglobin: 12.3 g/dL (ref 11.1–15.9)
MCH: 25.7 pg — ABNORMAL LOW (ref 26.6–33.0)
MCHC: 31.9 g/dL (ref 31.5–35.7)
MCV: 81 fL (ref 79–97)
Platelets: 261 10*3/uL (ref 150–450)
RBC: 4.79 x10E6/uL (ref 3.77–5.28)
RDW: 13.4 % (ref 11.7–15.4)
WBC: 5 10*3/uL (ref 3.4–10.8)

## 2019-12-25 LAB — VITAMIN D 25 HYDROXY (VIT D DEFICIENCY, FRACTURES): Vit D, 25-Hydroxy: 59.7 ng/mL (ref 30.0–100.0)

## 2019-12-25 LAB — LIPID PANEL
Chol/HDL Ratio: 2.7 ratio (ref 0.0–4.4)
Cholesterol, Total: 192 mg/dL (ref 100–199)
HDL: 71 mg/dL (ref >39)
LDL Chol Calc (NIH): 109 mg/dL — ABNORMAL HIGH (ref 0–99)
Triglycerides: 67 mg/dL (ref 0–149)
VLDL Cholesterol Cal: 12 mg/dL (ref 5–40)

## 2019-12-25 LAB — TSH: TSH: 1.31 u[IU]/mL (ref 0.450–4.500)

## 2019-12-25 LAB — HEPATITIS C ANTIBODY: Hep C Virus Ab: <0.1 s/co ratio (ref 0.0–0.9)

## 2020-01-07 ENCOUNTER — Other Ambulatory Visit: Payer: Self-pay | Admitting: Nurse Practitioner

## 2020-01-07 DIAGNOSIS — Z9181 History of falling: Secondary | ICD-10-CM

## 2020-01-07 DIAGNOSIS — Z8249 Family history of ischemic heart disease and other diseases of the circulatory system: Secondary | ICD-10-CM

## 2020-01-16 ENCOUNTER — Ambulatory Visit
Admission: RE | Admit: 2020-01-16 | Discharge: 2020-01-16 | Disposition: A | Discharge status: Home / Self Care | Payer: Managed Care, Other (non HMO) | Source: Ambulatory Visit | Attending: Nurse Practitioner | Admitting: Nurse Practitioner

## 2020-01-16 ENCOUNTER — Other Ambulatory Visit: Payer: Self-pay

## 2020-01-16 ENCOUNTER — Other Ambulatory Visit: Payer: Managed Care, Other (non HMO)

## 2020-01-16 DIAGNOSIS — Z1231 Encounter for screening mammogram for malignant neoplasm of breast: Secondary | ICD-10-CM

## 2020-02-03 ENCOUNTER — Ambulatory Visit
Admission: RE | Admit: 2020-02-03 | Discharge: 2020-02-03 | Disposition: A | Discharge status: Home / Self Care | Payer: Managed Care, Other (non HMO) | Source: Ambulatory Visit | Attending: Nurse Practitioner | Admitting: Nurse Practitioner

## 2020-02-03 ENCOUNTER — Other Ambulatory Visit: Payer: Self-pay

## 2020-02-03 DIAGNOSIS — Z8249 Family history of ischemic heart disease and other diseases of the circulatory system: Secondary | ICD-10-CM

## 2020-02-03 DIAGNOSIS — Z9181 History of falling: Secondary | ICD-10-CM

## 2020-03-31 ENCOUNTER — Ambulatory Visit (INDEPENDENT_AMBULATORY_CARE_PROVIDER_SITE_OTHER): Payer: Managed Care, Other (non HMO) | Admitting: Nurse Practitioner

## 2020-03-31 ENCOUNTER — Encounter: Payer: Self-pay | Admitting: Nurse Practitioner

## 2020-03-31 ENCOUNTER — Other Ambulatory Visit: Payer: Self-pay

## 2020-03-31 VITALS — BP 130/80 | HR 70 | Temp 98.4°F | Ht 64.2 in | Wt 223.0 lb

## 2020-03-31 DIAGNOSIS — R5383 Other fatigue: Secondary | ICD-10-CM | POA: Diagnosis not present

## 2020-03-31 DIAGNOSIS — N39490 Overflow incontinence: Secondary | ICD-10-CM | POA: Diagnosis not present

## 2020-03-31 DIAGNOSIS — Z23 Encounter for immunization: Secondary | ICD-10-CM | POA: Diagnosis not present

## 2020-03-31 DIAGNOSIS — I10 Essential (primary) hypertension: Secondary | ICD-10-CM

## 2020-03-31 MED ORDER — MAGNESIUM 250 MG PO TABS: 1.0000 | ORAL_TABLET | Freq: Every evening | ORAL | 2 refills | Status: DC

## 2020-03-31 NOTE — Progress Notes (Signed)
I,Marcelis Wissner [employer] as a Education administrator for [employer], FNP.,have documented all relevant documentation on the behalf of Minette Brine, FNP,as directed by  Minette Brine, FNP while in the presence of Minette Brine, [place]. This visit occurred during the SARS-CoV-2 public health emergency.  Safety protocols were in place, including screening questions prior to the visit, additional usage of staff PPE, and extensive cleaning of exam room while observing appropriate contact time as indicated for disinfecting solutions.  Subjective:     Patient ID: Carmen Kim , female    DOB: 10/27/1965 , 54 y.o.   MRN: [id number]   Chief Complaint  Patient presents with  . eleavted blood pressure  . urinary incontinence f/u    HPI  She is going [facility] for pelvic training. She was using poise for a while and she is not having to use as many.  She continues to feel clutzy, having motor issues.  She feels she is not a person who typically dropping items. She has informed her psychiatrist of these symptoms and have had a decrease in her medication Latuda to 80mg  from 100mg  her prozac has been increased to 40 mg from 20 mg.      Past Medical History:  Diagnosis Date  . Anemia   . Bipolar affect, depressed ([place])   . Depressed   . Hemorrhoids   . IBS (irritable bowel syndrome)   . Seizures ([place]) 2008   from Wellbutrin only     Family History  Problem Relation Age of Onset  . Diabetes Mother   . Hypertension Mother   . Cerebral aneurysm Mother   . Colon cancer Neg Hx   . Esophageal cancer Neg Hx   . Rectal cancer Neg Hx   . Stomach cancer Neg Hx   . Colon polyps Neg Hx      Current Outpatient Medications:  .  aspirin-acetaminophen-caffeine (EXCEDRIN MIGRAINE) 250-250-65 MG tablet, Take by mouth every 6 (six) hours as needed for headache., Disp: , Rfl:  .  clonazePAM (KLONOPIN) 1 MG tablet, Take 1 mg by mouth 2 (two) times daily as needed. One at bedtime as needed for sleep , Disp: , Rfl:  .   FLUoxetine (PROZAC) 40 MG capsule, , Disp: , Rfl:  .  lamoTRIgine (LAMICTAL) 100 MG tablet, 100 mg 2 (two) times daily. , Disp: , Rfl: 1 .  lurasidone (LATUDA) 80 MG TABS tablet, Take 80 mg by mouth daily with breakfast., Disp: , Rfl:  .  propranolol (INDERAL) 20 MG tablet, Take 20 mg by mouth daily at 2 am., Disp: , Rfl:  .  Vitamin D, Ergocalciferol, (DRISDOL) 1.25 MG (50000 UNIT) CAPS capsule, TAKE 1 CAPSULE (50,000 UNITS TOTAL) BY MOUTH 2 (TWO) TIMES A WEEK., Disp: 24 capsule, Rfl: 0 .  Magnesium 250 MG TABS, Take 1 tablet (250 mg total) by mouth every evening. Take with evening meal, Disp: 30 tablet, Rfl: 2   Allergies  Allergen Reactions  . Wellbutrin [Bupropion] Other (See Comments)    seizures     Review of Systems  Constitutional: Negative.   HENT: Negative.   Eyes: Negative.   Respiratory: Negative.   Cardiovascular: Negative.  Negative for chest pain, palpitations and leg swelling.  Gastrointestinal: Negative.   Endocrine: Negative.   Genitourinary: Negative.   Musculoskeletal: Negative.   Skin: Negative.   Neurological: Negative.   Hematological: Negative.   Psychiatric/Behavioral: Negative.      Today's Vitals   03/31/20 1523  BP: 130/80  Pulse: 70  Temp:  98.4 F (36.9 C)  TempSrc: Oral  Weight: 223 lb (101.2 kg)  Height: 5' 4.2" (1.631 m)  PainSc: 0-No pain   Body mass index is 38.04 kg/m.   Objective:  Physical Exam Vitals reviewed.  Constitutional:      General: She is not in acute distress.    Appearance: Normal appearance.  Cardiovascular:     Rate and Rhythm: Normal rate and regular rhythm.     Pulses: Normal pulses.     Heart sounds: Normal heart sounds. No murmur heard.   Pulmonary:     Effort: Pulmonary effort is normal. No respiratory distress.     Breath sounds: Normal breath sounds.  Neurological:     General: No focal deficit present.     Mental Status: She is alert and oriented to person, place, and time.     Cranial Nerves: No  cranial nerve deficit.  Psychiatric:        Mood and Affect: Mood normal.        Behavior: Behavior normal.        Thought Content: Thought content normal.        Judgment: Judgment normal.         Assessment And Plan:     1. Elevated blood pressure reading in office with diagnosis of hypertension  Blood pressure is better controlled, she is to take magnesium with evening meal and avoid high salt foods. - Magnesium 250 MG TABS; Take 1 tablet (250 mg total) by mouth every evening. Take with evening meal  Dispense: 30 tablet; Refill: 2  2. Fatigue, unspecified type  Will check thyroid antibodies and vitamin B12   TSH was normal  Will also refer for sleep study  She had an MRA to evaluate as screening for a brain aneurysm due to family history, was normal.  - Thyroid antibodies - Vitamin B12 - Ambulatory referral to Sleep Studies  3. Overflow incontinence of urine  Continue with PT, has improved some if worsens again will consider medication therapy  4. Need for influenza vaccination  Influenza vaccine administered  Encouraged to take Tylenol as needed for fever or muscle aches. - Flu Vaccine QUAD 6+ mos PF IM (Fluarix Quad PF)     Patient was given opportunity to ask questions. Patient verbalized understanding of the plan and was able to repeat key elements of the plan. All questions were answered to their satisfaction.  Minette Brine, FNP   I, Minette Brine, FNP, have reviewed all documentation for this visit. The documentation on 03/31/20 for the exam, diagnosis, procedures, and orders are all accurate and complete.  THE PATIENT IS ENCOURAGED TO PRACTICE SOCIAL DISTANCING DUE TO THE COVID-19 PANDEMIC.

## 2020-03-31 NOTE — Patient Instructions (Signed)
Influenza Virus Vaccine (Flucelvax) What is this medicine? INFLUENZA VIRUS VACCINE (in floo EN zuh VAHY ruhs vak SEEN) helps to reduce the risk of getting influenza also known as the flu. The vaccine only helps protect you against some strains of the flu. This medicine may be used for other purposes; ask your health care provider or pharmacist if you have questions. COMMON BRAND NAME(S): FLUCELVAX What should I tell my health care provider before I take this medicine? They need to know if you have any of these conditions:  bleeding disorder like hemophilia  fever or infection  Guillain-Barre syndrome or other neurological problems  immune system problems  infection with the human immunodeficiency virus (HIV) or AIDS  low blood platelet counts  multiple sclerosis  an unusual or allergic reaction to influenza virus vaccine, other medicines, foods, dyes or preservatives  pregnant or trying to get pregnant  breast-feeding How should I use this medicine? This vaccine is for injection into a muscle. It is given by a health care professional. A copy of Vaccine Information Statements will be given before each vaccination. Read this sheet carefully each time. The sheet may change frequently. Talk to your pediatrician regarding the use of this medicine in children. Special care may be needed. Overdosage: If you think you've taken too much of this medicine contact a poison control center or emergency room at once. Overdosage: If you think you have taken too much of this medicine contact a poison control center or emergency room at once. NOTE: This medicine is only for you. Do not share this medicine with others. What if I miss a dose? This does not apply. What may interact with this medicine?  chemotherapy or radiation therapy  medicines that lower your immune system like etanercept, anakinra, infliximab, and adalimumab  medicines that treat or prevent blood clots like  warfarin  phenytoin  steroid medicines like prednisone or cortisone  theophylline  vaccines This list may not describe all possible interactions. Give your health care provider a list of all the medicines, herbs, non-prescription drugs, or dietary supplements you use. Also tell them if you smoke, drink alcohol, or use illegal drugs. Some items may interact with your medicine. What should I watch for while using this medicine? Report any side effects that do not go away within 3 days to your doctor or health care professional. Call your health care provider if any unusual symptoms occur within 6 weeks of receiving this vaccine. You may still catch the flu, but the illness is not usually as bad. You cannot get the flu from the vaccine. The vaccine will not protect against colds or other illnesses that may cause fever. The vaccine is needed every year. What side effects may I notice from receiving this medicine? Side effects that you should report to your doctor or health care professional as soon as possible:  allergic reactions like skin rash, itching or hives, swelling of the face, lips, or tongue Side effects that usually do not require medical attention (Report these to your doctor or health care professional if they continue or are bothersome.):  fever  headache  muscle aches and pains  pain, tenderness, redness, or swelling at the injection site  tiredness This list may not describe all possible side effects. Call your doctor for medical advice about side effects. You may report side effects to FDA at 1-800-FDA-1088. Where should I keep my medicine? The vaccine will be given by a health care professional in a clinic, pharmacy, doctor's   office, or other health care setting. You will not be given vaccine doses to store at home. NOTE: This sheet is a summary. It may not cover all possible information. If you have questions about this medicine, talk to your doctor, pharmacist, or  health care provider.  2020 Elsevier/Gold Standard (2011-05-03 14:06:47)  

## 2020-04-01 DIAGNOSIS — Z23 Encounter for immunization: Secondary | ICD-10-CM

## 2020-04-01 LAB — THYROID ANTIBODIES
Thyroglobulin Antibody: <1 IU/mL (ref 0.0–0.9)
Thyroperoxidase Ab SerPl-aCnc: <8 IU/mL (ref 0–34)

## 2020-04-01 LAB — VITAMIN B12: Vitamin B-12: 1174 pg/mL (ref 232–1245)

## 2020-05-13 ENCOUNTER — Telehealth: Payer: Self-pay

## 2020-05-13 NOTE — Telephone Encounter (Signed)
Consent

## 2020-05-16 ENCOUNTER — Encounter: Payer: Self-pay | Admitting: Nurse Practitioner

## 2020-05-17 ENCOUNTER — Telehealth (INDEPENDENT_AMBULATORY_CARE_PROVIDER_SITE_OTHER): Payer: Managed Care, Other (non HMO) | Admitting: Nurse Practitioner

## 2020-05-17 ENCOUNTER — Encounter: Payer: Self-pay | Admitting: Nurse Practitioner

## 2020-05-17 VITALS — Ht 64.2 in | Wt 215.0 lb

## 2020-05-17 DIAGNOSIS — F3181 Bipolar II disorder: Secondary | ICD-10-CM

## 2020-05-17 NOTE — Progress Notes (Signed)
Virtual Visit via Mychart   This visit type was conducted due to national recommendations for restrictions regarding the COVID-19 Pandemic (e.g. social distancing) in an effort to limit this patient's exposure and mitigate transmission in our community.  Due to her co-morbid illnesses, this patient is at least at moderate risk for complications without adequate follow up.  This format is felt to be most appropriate for this patient at this time.  All issues noted in this document were discussed and addressed.  A limited physical exam was performed with this format.    This visit type was conducted due to national recommendations for restrictions regarding the COVID-19 Pandemic (e.g. social distancing) in an effort to limit this patient's exposure and mitigate transmission in our community.  Patients identity confirmed using two different identifiers.  This format is felt to be most appropriate for this patient at this time.  All issues noted in this document were discussed and addressed.  No physical exam was performed (except for noted visual exam findings with Video Visits).    Date:  05/23/2020   ID:  Carmen Kim, DOB 02/08/1966, MRN [id number]  Patient Location:    Provider location:   Office    Chief Complaint:    History of Present Illness:    Carmen Kim is a 54 y.o. female who presents via video conferencing for a telehealth visit today.    The patient does not have symptoms concerning for COVID-19 infection (fever, chills, cough, or new shortness of breath).   She is trying to get an FMLA - she is reports she has been in bed for the last week and has been in contact with her therapist Glenetta Borg [phone]). She is having a problem with her bipolar. Dr. Darleene Cleaver is her psychiatrist. She has not been to work since Monday. She is contemplating going inpatient but does not know where to go. Denies wanting to harm herself or any one else.     Past Medical History:   Diagnosis Date   Anemia    Bipolar affect, depressed ([place])    Depressed    Hemorrhoids    IBS (irritable bowel syndrome)    Seizures ([place]) 2008   from Wellbutrin only   Past Surgical History:  Procedure Laterality Date   WISDOM TOOTH EXTRACTION       Current Meds  Medication Sig   aspirin-acetaminophen-caffeine (EXCEDRIN MIGRAINE) 250-250-65 MG tablet Take by mouth every 6 (six) hours as needed for headache.   clonazePAM (KLONOPIN) 1 MG tablet Take 1 mg by mouth 2 (two) times daily as needed. One at bedtime as needed for sleep   FLUoxetine (PROZAC) 40 MG capsule    lamoTRIgine (LAMICTAL) 100 MG tablet 100 mg 2 (two) times daily.    lurasidone (LATUDA) 80 MG TABS tablet Take 80 mg by mouth daily with breakfast.   Magnesium 250 MG TABS Take 1 tablet (250 mg total) by mouth every evening. Take with evening meal   propranolol (INDERAL) 20 MG tablet Take 20 mg by mouth daily at 2 am.   Vitamin D, Ergocalciferol, (DRISDOL) 1.25 MG (50000 UNIT) CAPS capsule TAKE 1 CAPSULE (50,000 UNITS TOTAL) BY MOUTH 2 (TWO) TIMES A WEEK.     Allergies:   Wellbutrin [bupropion]   Social History   Tobacco Use   Smoking status: Never Smoker   Smokeless tobacco: Never Used  Vaping Use   Vaping Use: Never used  Substance Use Topics   Alcohol use: Yes  Alcohol/week: 1.0 standard drink    Types: 1 Glasses of wine per week    Comment: occ   Drug use: No     Family Hx: The patient's family history includes Cerebral aneurysm in her mother; Diabetes in her mother; Hypertension in her mother. There is no history of Colon cancer, Esophageal cancer, Rectal cancer, Stomach cancer, or Colon polyps.  ROS:   Please see the history of present illness.    Review of Systems  Constitutional: Negative.  Negative for chills.  Respiratory: Negative.   Cardiovascular: Negative.   Neurological: Negative for dizziness and tingling.  Psychiatric/Behavioral: Positive for depression. Negative  for hallucinations and suicidal ideas.       Reports her "bipolar is out of control"    All other systems reviewed and are negative.   Labs/Other Tests and Data Reviewed:    Recent Labs: 12/24/2019: ALT 12; BUN 14; Creatinine, Ser 1.11; Hemoglobin 12.3; Platelets 261; Potassium 3.9; Sodium 135; TSH 1.310   Recent Lipid Panel Lab Results  Component Value Date/Time   CHOL 192 12/24/2019 05:04 PM   TRIG 67 12/24/2019 05:04 PM   HDL 71 12/24/2019 05:04 PM   CHOLHDL 2.7 12/24/2019 05:04 PM   CHOLHDL 3 01/25/2017 09:33 AM   LDLCALC 109 (H) 12/24/2019 05:04 PM    Wt Readings from Last 3 Encounters:  05/17/20 215 lb (97.5 kg)  03/31/20 223 lb (101.2 kg)  12/24/19 234 lb 9.6 oz (106.4 kg)     Exam:    Vital Signs:  Ht 5' 4.2" (1.631 m)    Wt 215 lb (97.5 kg)    LMP 04/19/2014 (Exact Date)    BMI 36.68 kg/m     Physical Exam Vitals reviewed.  Constitutional:      General: She is not in acute distress.    Appearance: Normal appearance. She is obese.  Pulmonary:     Effort: Pulmonary effort is normal. No respiratory distress.  Neurological:     General: No focal deficit present.     Mental Status: She is alert and oriented to person, place, and time.     Cranial Nerves: No cranial nerve deficit.  Psychiatric:        Thought Content: Thought content normal.        Judgment: Judgment normal.     Comments: Tearful during visit and voices frustration     ASSESSMENT & PLAN:    1. Bipolar 2 disorder [facility])  Patient reports she is having a bipolar episode and is having difficulty with being able to function at work. I have called Dr. Marquis Buggy office to inquire what the next step is if the patient is interested in inpatient treatment.  I will provide her a note to be off work for the next 2 weeks as she is trying to get set up for possible inpatient treatment. She is advised to go to the ER for an assessment.  This was recommended after speaking with Dr. Marquis Buggy office you  mentioned she has an appt in January with him and needed to be seen prior to getting her FMLA form completed as she had seen him earlier in November.  COVID-19 Education: The signs and symptoms of COVID-19 were discussed with the patient and how to seek care for testing (follow up with PCP or arrange E-visit). The importance of social distancing was discussed today.  Patient Risk:   After full review of this patients clinical status, I feel that they are at least moderate risk at  this time.  Time:   Today, I have spent 11 minutes/ seconds with the patient with telehealth technology discussing above diagnoses.     Medication Adjustments/Labs and Tests Ordered: Current medicines are reviewed at length with the patient today.  Concerns regarding medicines are outlined above.   Tests Ordered: No orders of the defined types were placed in this encounter.   Medication Changes: No orders of the defined types were placed in this encounter.   Disposition:  Follow up prn  Signed, Minette Brine, FNP

## 2020-05-23 ENCOUNTER — Encounter: Payer: Self-pay | Admitting: Nurse Practitioner

## 2020-11-25 ENCOUNTER — Other Ambulatory Visit: Payer: Self-pay | Admitting: Nurse Practitioner

## 2020-11-25 DIAGNOSIS — E559 Vitamin D deficiency, unspecified: Secondary | ICD-10-CM

## 2020-12-29 ENCOUNTER — Encounter: Payer: Managed Care, Other (non HMO) | Admitting: Nurse Practitioner

## 2021-03-04 ENCOUNTER — Other Ambulatory Visit: Payer: Self-pay | Admitting: Nurse Practitioner

## 2021-03-04 DIAGNOSIS — Z1231 Encounter for screening mammogram for malignant neoplasm of breast: Secondary | ICD-10-CM

## 2021-03-09 ENCOUNTER — Other Ambulatory Visit: Payer: Self-pay

## 2021-03-09 ENCOUNTER — Ambulatory Visit
Admission: RE | Admit: 2021-03-09 | Discharge: 2021-03-09 | Disposition: A | Discharge status: Home / Self Care | Payer: Managed Care, Other (non HMO) | Source: Ambulatory Visit | Attending: Nurse Practitioner | Admitting: Nurse Practitioner

## 2021-03-09 DIAGNOSIS — Z1231 Encounter for screening mammogram for malignant neoplasm of breast: Secondary | ICD-10-CM

## 2021-04-14 ENCOUNTER — Ambulatory Visit (INDEPENDENT_AMBULATORY_CARE_PROVIDER_SITE_OTHER): Payer: Managed Care, Other (non HMO) | Admitting: Nurse Practitioner

## 2021-04-14 ENCOUNTER — Other Ambulatory Visit: Payer: Self-pay

## 2021-04-14 ENCOUNTER — Encounter: Payer: Self-pay | Admitting: Nurse Practitioner

## 2021-04-14 VITALS — BP 106/78 | HR 98 | Temp 98.1°F | Ht 64.0 in | Wt 239.6 lb

## 2021-04-14 DIAGNOSIS — R200 Anesthesia of skin: Secondary | ICD-10-CM

## 2021-04-14 DIAGNOSIS — E66813 Obesity, class 3: Secondary | ICD-10-CM

## 2021-04-14 DIAGNOSIS — E559 Vitamin D deficiency, unspecified: Secondary | ICD-10-CM | POA: Diagnosis not present

## 2021-04-14 DIAGNOSIS — Z23 Encounter for immunization: Secondary | ICD-10-CM | POA: Diagnosis not present

## 2021-04-14 DIAGNOSIS — Z0000 Encounter for general adult medical examination without abnormal findings: Secondary | ICD-10-CM | POA: Diagnosis not present

## 2021-04-14 DIAGNOSIS — R5383 Other fatigue: Secondary | ICD-10-CM

## 2021-04-14 DIAGNOSIS — R202 Paresthesia of skin: Secondary | ICD-10-CM

## 2021-04-14 DIAGNOSIS — Z6841 Body Mass Index (BMI) 40.0 and over, adult: Secondary | ICD-10-CM

## 2021-04-14 DIAGNOSIS — F3181 Bipolar II disorder: Secondary | ICD-10-CM

## 2021-04-14 LAB — POCT URINALYSIS DIPSTICK
Bilirubin, UA: NEGATIVE
Glucose, UA: NEGATIVE
Ketones, UA: NEGATIVE
Nitrite, UA: NEGATIVE
Protein, UA: NEGATIVE
Spec Grav, UA: 1.01 (ref 1.010–1.025)
Urobilinogen, UA: 0.2 E.U./dL
pH, UA: 5.5 (ref 5.0–8.0)

## 2021-04-14 LAB — POCT UA - MICROALBUMIN
Albumin/Creatinine Ratio, Urine, POC: 30
Creatinine, POC: 300 mg/dL
Microalbumin Ur, POC: 10 mg/L

## 2021-04-14 NOTE — Progress Notes (Signed)
I,Carmen Kim,acting as a Education administrator for Carmen Brine, FNP.,have documented all relevant documentation on the behalf of Carmen Brine, FNP,as directed by  Carmen Brine, FNP while in the presence of Carmen Kim, [place].   This visit occurred during the SARS-CoV-2 public health emergency.  Safety protocols were in place, including screening questions prior to the visit, additional usage of staff PPE, and extensive cleaning of exam room while observing appropriate contact time as indicated for disinfecting solutions.  Subjective:     Patient ID: Carmen Kim , female    DOB: [birth year] , 55 y.o.   MRN: [id number]   Chief Complaint  Patient presents with   Annual Exam     HPI  Pt here for HM. She reports pain in her arms and legs rated 4/10. Along with numbness. She does report a flu shot in the last few months, no date given she will give [place] a call back with info.   Wt Readings from Last 3 Encounters: 04/14/21 : 239 lb 9.6 oz (108.7 kg) 05/17/20 : 215 lb (97.5 kg) 03/31/20 : 223 lb (101.2 kg)      Past Medical History:  Diagnosis Date   Anemia    Bipolar affect, depressed ([place])    Depressed    Hemorrhoids    IBS (irritable bowel syndrome)    Seizures ([place]) 2008   from Wellbutrin only     Family History  Problem Relation Age of Onset   Diabetes Mother    Hypertension Mother    Cerebral aneurysm Mother    Colon cancer Neg Hx    Esophageal cancer Neg Hx    Rectal cancer Neg Hx    Stomach cancer Neg Hx    Colon polyps Neg Hx      Current Outpatient Medications:    aspirin-acetaminophen-caffeine (EXCEDRIN MIGRAINE) 250-250-65 MG tablet, Take by mouth every 6 (six) hours as needed for headache., Disp: , Rfl:    clonazePAM (KLONOPIN) 1 MG tablet, Take 1 mg by mouth 2 (two) times daily as needed. One at bedtime as needed for sleep, Disp: , Rfl:    Magnesium 250 MG TABS, Take 1 tablet (250 mg total) by mouth every evening. Take with evening meal, Disp: 30 tablet, Rfl: 2    Vitamin D, Ergocalciferol, (DRISDOL) 1.25 MG (50000 UNIT) CAPS capsule, TAKE 1 CAPSULE (50,000 UNITS TOTAL) BY MOUTH 2 (TWO) TIMES A WEEK., Disp: 24 capsule, Rfl: 0   busPIRone (BUSPAR) 10 MG tablet, Take 10 mg by mouth 2 (two) times daily., Disp: , Rfl:    CAPLYTA 42 MG capsule, Take 42 mg by mouth at bedtime., Disp: , Rfl:    desvenlafaxine (PRISTIQ) 100 MG 24 hr tablet, Take 100 mg by mouth daily., Disp: , Rfl:    L-Methylfolate 15 MG TABS, Take 15 mg by mouth daily., Disp: , Rfl:    lamoTRIgine (LAMICTAL) 200 MG tablet, Take 400 mg by mouth at bedtime., Disp: , Rfl:    Allergies  Allergen Reactions   Wellbutrin [Bupropion] Other (See Comments)    seizures      The patient states she is post menopausal status.  Patient's last menstrual period was 04/19/2014 (exact date).. Negative for Dysmenorrhea and Negative for Menorrhagia. Negative for: breast discharge, breast lump(s), breast pain and breast self exam. Associated symptoms include abnormal vaginal bleeding. Pertinent negatives include abnormal bleeding (hematology), anxiety, decreased libido, depression, difficulty falling sleep, dyspareunia, history of infertility, nocturia, sexual dysfunction, sleep disturbances, urinary incontinence, urinary urgency, vaginal discharge and vaginal  itching. Diet regular; increasing her sugar intake. The patient states her exercise level is none.   The patient's tobacco use is:  Social History   Tobacco Use  Smoking Status Never  Smokeless Tobacco Never   She has been exposed to passive smoke. The patient's alcohol use is:  Social History   Substance and Sexual Activity  Alcohol Use Yes   Alcohol/week: 1.0 standard drink   Types: 1 Glasses of wine per week   Comment: occ   Additional information: Last pap 12/17/2018, next one scheduled for 12/16/2021.    Review of Systems  Constitutional: Negative.   HENT: Negative.    Eyes: Negative.   Respiratory: Negative.    Cardiovascular: Negative.    Gastrointestinal: Negative.   Endocrine: Negative.   Genitourinary: Negative.   Musculoskeletal:  Positive for arthralgias.  Skin: Negative.   Allergic/Immunologic: Negative.   Neurological: Negative.   Hematological: Negative.   Psychiatric/Behavioral: Negative.      Today's Vitals   04/14/21 1159 04/14/21 1213  BP: 106/78   Pulse: 98   Temp: 98.1 F (36.7 C)   Weight:  239 lb 9.6 oz (108.7 kg)  Height: _0  (1.626 m)   PainSc: 4     Body mass index is 41.13 kg/m.   Objective:  Physical Exam Constitutional:      General: She is not in acute distress.    Appearance: Normal appearance. She is well-developed. She is obese.  HENT:     Head: Normocephalic and atraumatic.     Right Ear: Hearing, tympanic membrane, ear canal and external ear normal. There is no impacted cerumen.     Left Ear: Hearing, tympanic membrane, ear canal and external ear normal. There is no impacted cerumen.     Nose:     Comments: Deferred - masked    Mouth/Throat:     Comments: Deferred - masked Eyes:     General: Lids are normal.     Extraocular Movements: Extraocular movements intact.     Conjunctiva/sclera: Conjunctivae normal.     Pupils: Pupils are equal, round, and reactive to light.     Funduscopic exam:    Right eye: No papilledema.        Left eye: No papilledema.  Neck:     Thyroid: No thyroid mass.     Vascular: No carotid bruit.  Cardiovascular:     Rate and Rhythm: Normal rate and regular rhythm.     Pulses: Normal pulses.     Heart sounds: Normal heart sounds. No murmur heard. Pulmonary:     Effort: Pulmonary effort is normal. No respiratory distress.     Breath sounds: Normal breath sounds. No wheezing.  Chest:     Chest wall: No mass.  Breasts:    Tanner Score is 5.     Right: Normal. No mass or tenderness.     Left: Normal. No mass or tenderness.  Abdominal:     General: Abdomen is flat. Bowel sounds are normal. There is no distension.     Palpations: Abdomen is  soft.     Tenderness: There is no abdominal tenderness.  Musculoskeletal:        General: No swelling or tenderness. Normal range of motion.     Cervical back: Full passive range of motion without pain, normal range of motion and neck supple.     Right lower leg: No edema.     Left lower leg: No edema.  Lymphadenopathy:     Upper  Body:     Right upper body: No supraclavicular, axillary or pectoral adenopathy.     Left upper body: No supraclavicular, axillary or pectoral adenopathy.  Skin:    General: Skin is warm and dry.     Capillary Refill: Capillary refill takes less than 2 seconds.  Neurological:     General: No focal deficit present.     Mental Status: She is alert and oriented to person, place, and time.     Cranial Nerves: No cranial nerve deficit.     Sensory: No sensory deficit.     Motor: No weakness.     Comments: Negative phalen and tinel  Psychiatric:        Mood and Affect: Mood normal.        Behavior: Behavior normal.        Thought Content: Thought content normal.        Judgment: Judgment normal.        Assessment And Plan:     1. Annual physical exam Behavior modifications discussed and diet history reviewed.   Pt will continue to exercise regularly and modify diet with low GI, plant based foods and decrease intake of processed foods.  Recommend intake of daily multivitamin, Vitamin D, and calcium.  Recommend mammogram and colonoscopy (up to date) for preventive screenings, as well as recommend immunizations that include influenza, TDAP, and Shingles - POCT Urinalysis Dipstick (81002) - POCT UA - Microalbumin - CMP14+EGFR - CBC - Hemoglobin A1c - Lipid panel  2. Fatigue, unspecified type Comments: No metabolic cause, due to her obesity she is at higher risk for sleep apnea. Also may have a component of depression - Ambulatory referral to Neurology  3. Vitamin D deficiency Will check vitamin D level and supplement as needed.    Also encouraged to  spend 15 minutes in the sun daily.  - VITAMIN D 25 Hydroxy (Vit-D Deficiency, Fractures)  4. Encounter for immunization Given in office, return in 2-6 months - Varicella-zoster vaccine IM (Shingrix)  5. Numbness and tingling Comments: Persistent numbness and tingling to lower extremities and some to her hands, metabolic causes are normal. Will refer to Neurology - Ambulatory referral to Neurology  6. Bipolar 2 disorder ([place]) Comments: Being managed by psychiatry and has had a few medication changes recently  7. Class 3 severe obesity due to excess calories without serious comorbidity with body mass index (BMI) of 40.0 to 44.9 in adult [facility]) Discussed importance of limiting her intake of high fat/high carb foods Encouraged to exercise regularly at least 150 min/week   Patient was given opportunity to ask questions. Patient verbalized understanding of the plan and was able to repeat key elements of the plan. All questions were answered to their satisfaction.   Carmen Brine, FNP   I, Carmen Brine, FNP, have reviewed all documentation for this visit. The documentation on 04/22/21 for the exam, diagnosis, procedures, and orders are all accurate and complete.  THE PATIENT IS ENCOURAGED TO PRACTICE SOCIAL DISTANCING DUE TO THE COVID-19 PANDEMIC.

## 2021-04-14 NOTE — Patient Instructions (Signed)

## 2021-04-15 LAB — CMP14+EGFR
ALT: 12 IU/L (ref 0–32)
AST: 15 IU/L (ref 0–40)
Albumin/Globulin Ratio: 1.5 (ref 1.2–2.2)
Albumin: 4.3 g/dL (ref 3.8–4.9)
Alkaline Phosphatase: 128 IU/L — ABNORMAL HIGH (ref 44–121)
BUN/Creatinine Ratio: 14 (ref 9–23)
BUN: 13 mg/dL (ref 6–24)
Bilirubin Total: 0.4 mg/dL (ref 0.0–1.2)
CO2: 26 mmol/L (ref 20–29)
Calcium: 9.9 mg/dL (ref 8.7–10.2)
Chloride: 106 mmol/L (ref 96–106)
Creatinine, Ser: 0.95 mg/dL (ref 0.57–1.00)
Globulin, Total: 2.8 g/dL (ref 1.5–4.5)
Glucose: 80 mg/dL (ref 70–99)
Potassium: 4.4 mmol/L (ref 3.5–5.2)
Sodium: 143 mmol/L (ref 134–144)
Total Protein: 7.1 g/dL (ref 6.0–8.5)
eGFR: 71 mL/min/{1.73_m2} (ref >59)

## 2021-04-15 LAB — CBC
Hematocrit: 36.4 % (ref 34.0–46.6)
Hemoglobin: 11.4 g/dL (ref 11.1–15.9)
MCH: 25.4 pg — ABNORMAL LOW (ref 26.6–33.0)
MCHC: 31.3 g/dL — ABNORMAL LOW (ref 31.5–35.7)
MCV: 81 fL (ref 79–97)
Platelets: 247 10*3/uL (ref 150–450)
RBC: 4.49 x10E6/uL (ref 3.77–5.28)
RDW: 13.1 % (ref 11.7–15.4)
WBC: 4.3 10*3/uL (ref 3.4–10.8)

## 2021-04-15 LAB — LIPID PANEL
Chol/HDL Ratio: 2.8 ratio (ref 0.0–4.4)
Cholesterol, Total: 212 mg/dL — ABNORMAL HIGH (ref 100–199)
HDL: 76 mg/dL (ref >39)
LDL Chol Calc (NIH): 123 mg/dL — ABNORMAL HIGH (ref 0–99)
Triglycerides: 71 mg/dL (ref 0–149)
VLDL Cholesterol Cal: 13 mg/dL (ref 5–40)

## 2021-04-15 LAB — HEMOGLOBIN A1C
Est. average glucose Bld gHb Est-mCnc: 108 mg/dL
Hgb A1c MFr Bld: 5.4 % (ref 4.8–5.6)

## 2021-04-15 LAB — VITAMIN D 25 HYDROXY (VIT D DEFICIENCY, FRACTURES): Vit D, 25-Hydroxy: 51.4 ng/mL (ref 30.0–100.0)

## 2021-04-22 ENCOUNTER — Encounter: Payer: Self-pay | Admitting: Nurse Practitioner

## 2021-06-29 ENCOUNTER — Institutional Professional Consult (permissible substitution): Payer: Managed Care, Other (non HMO) | Admitting: Neurology

## 2021-06-29 ENCOUNTER — Encounter: Payer: Self-pay | Admitting: Neurology

## 2021-07-19 ENCOUNTER — Ambulatory Visit: Payer: Managed Care, Other (non HMO)

## 2021-08-03 DIAGNOSIS — Z0289 Encounter for other administrative examinations: Secondary | ICD-10-CM

## 2021-08-16 ENCOUNTER — Other Ambulatory Visit: Payer: Self-pay | Admitting: Nurse Practitioner

## 2021-08-16 DIAGNOSIS — E559 Vitamin D deficiency, unspecified: Secondary | ICD-10-CM

## 2021-08-22 ENCOUNTER — Other Ambulatory Visit: Payer: Self-pay

## 2021-08-22 ENCOUNTER — Telehealth (INDEPENDENT_AMBULATORY_CARE_PROVIDER_SITE_OTHER): Payer: Managed Care, Other (non HMO) | Admitting: Psychiatry

## 2021-08-22 DIAGNOSIS — F332 Major depressive disorder, recurrent severe without psychotic features: Secondary | ICD-10-CM

## 2021-08-22 NOTE — Progress Notes (Signed)
Virtual Visit via Telephone Note ? ?I connected with Carmen Kim on 08/22/21 at  3:00 PM EDT by telephone and verified that I am speaking with the correct person using two identifiers. ? ?Location: ?Patient: Home ?Provider: Hospital ?  ?I discussed the limitations, risks, security and privacy concerns of performing an evaluation and management service by telephone and the availability of in person appointments. I also discussed with the patient that there may be a patient responsible charge related to this service. The patient expressed understanding and agreed to proceed. ? ? ?History of Present Illness: Patient was reached by telephone for this ECT consult.  56 year old woman referred by her primary psychiatric provider for consideration of ECT.  Patient was reached by telephone identities and the purpose of the appointment were established.  Patient states that she currently is suffering from depression with symptoms of depressed negative down hopeless mood essentially all of the time, extremely low energy all of the time, poor functioning with mental sluggishness and physical slowness and weakness.  Says her daily routine is that she still manages to work from home but rarely gets dressed properly and is not taking care of her home or her hygiene very well.  She has passive suicidal thoughts but denies having any plan or intention of acting on them.  She denies hallucinations or any psychotic symptoms.  Patient denies any alcohol or drug abuse.  She lives by herself.  Works from home.  Has only minimal support.  She is seeing an outpatient psychiatric provider and is taking the following regimen for treatment of her mood: Pristiq 100 mg in the morning, buspirone 10 mg twice a day, vitamin D unknown dose regularly, lamotrigine 400 mg at night, Caplyta 42 mg at night.  No longer taking clonazepam. ? ?Patient states she has suffered from depression since she was an adolescent.  Has had multiple episodes of  severe depression.  Does not describe a history of mania.  She has had hospitalizations in the past but none in a few years.  Has had suicide attempts in the past but none in at least the past 7 years.  Has been on a large number of antidepressant and mood stabilizing medicines often with side effects usually only with partial response.  She did have a seizure while taking bupropion in the past which was the most negative consequence of any medication. ? ?Does not have other major medical problems.  No history of coronary artery disease or other heart problems, no history of diabetes.  She does mention that she has developed a numbness and tingling in her extremities recently and has been referred to a neurologist.  Her description of it to me does not immediately suggest any obvious etiology. ? ?Patient was on time alert oriented and engaged in the conversation.  She states that she has had ECT in the past about 30 years ago with positive affect.  Affect mildly dysphoric but reactive.  Not tearful.  Thoughts lucid no loosening of associations no delusions.  Passive suicidal thoughts and hopelessness but no intention or plan of acting on it no homicidal ideation.  No evidence of psychosis. ? ?56 year old woman who is a potentially appropriate candidate for electroconvulsive therapy treatment of major depression.  Diagnosis is appropriate.  She has a history of treatment with multiple appropriate agents with only partial response and currently with no good response.  Continues to be extremely impaired and has a past history of severe impairment including suicidal behavior.  Patient  does not have any medical contraindications that would be a major problem for ECT.  Patient states that she would like to proceed with a plan for ECT treatment. ? ?We reviewed the structure of our program here and the way that atypical ECT treatment would work for an outpatient.  Side effects and negative outcomes including headache,  muscle aches, confusion, short-term memory impairment reviewed.  Also reviewed the rare consequences of severe problems related to general anesthesia including death.  Positive potential benefits include improved treatment of all depressive symptoms with improved functioning and quality of life.  Patient request that we proceed with treatment planning. ? ?I am going to refer the patient to the ECT team including utilization review and nursing.  We will proceed with making plans for a work-up.  Patient has been advised that the lamotrigine dose she is taking would be an impediment to ECT.  I suggest that she cut the dose in half now and then as we get closer to the time of treatment try cutting it down to just 100 mg a night which is a dose that I think would be acceptable probably.  Patient agrees to plan.  She knows she can call back to our office anytime. ? ?  ?Observations/Objective: ? ? ?Assessment and Plan: ? ? ?Follow Up Instructions: ? ?  ?I discussed the assessment and treatment plan with the patient. The patient was provided an opportunity to ask questions and all were answered. The patient agreed with the plan and demonstrated an understanding of the instructions. ?  ?The patient was advised to call back or seek an in-person evaluation if the symptoms worsen or if the condition fails to improve as anticipated. ? ?I provided 45 minutes of non-face-to-face time during this encounter. ? ? ?Alethia Berthold, MD  ?

## 2021-08-31 ENCOUNTER — Other Ambulatory Visit: Payer: Self-pay | Admitting: Psychiatry

## 2021-08-31 DIAGNOSIS — R200 Anesthesia of skin: Secondary | ICD-10-CM

## 2021-09-02 ENCOUNTER — Inpatient Hospital Stay: Admission: RE | Admit: 2021-09-02 | Payer: Managed Care, Other (non HMO) | Source: Ambulatory Visit

## 2021-09-09 ENCOUNTER — Encounter
Admission: RE | Admit: 2021-09-09 | Discharge: 2021-09-09 | Disposition: A | Discharge status: Home / Self Care | Payer: Managed Care, Other (non HMO) | Source: Ambulatory Visit | Attending: Psychiatry | Admitting: Psychiatry

## 2021-09-09 ENCOUNTER — Other Ambulatory Visit: Payer: Self-pay

## 2021-09-09 HISTORY — DX: Chronic kidney disease, unspecified: N18.9

## 2021-09-12 ENCOUNTER — Other Ambulatory Visit: Payer: Managed Care, Other (non HMO)

## 2021-09-14 ENCOUNTER — Other Ambulatory Visit: Payer: Self-pay | Admitting: Psychiatry

## 2021-09-14 ENCOUNTER — Ambulatory Visit
Admission: RE | Admit: 2021-09-14 | Discharge: 2021-09-14 | Disposition: A | Discharge status: Home / Self Care | Payer: Managed Care, Other (non HMO) | Source: Ambulatory Visit | Attending: Psychiatry | Admitting: Psychiatry

## 2021-09-14 ENCOUNTER — Other Ambulatory Visit: Payer: Managed Care, Other (non HMO)

## 2021-09-14 ENCOUNTER — Encounter
Admission: RE | Admit: 2021-09-14 | Discharge: 2021-09-14 | Disposition: A | Discharge status: Home / Self Care | Payer: Managed Care, Other (non HMO) | Source: Ambulatory Visit | Attending: Psychiatry | Admitting: Psychiatry

## 2021-09-14 DIAGNOSIS — R200 Anesthesia of skin: Secondary | ICD-10-CM

## 2021-09-14 DIAGNOSIS — F3181 Bipolar II disorder: Secondary | ICD-10-CM | POA: Diagnosis present

## 2021-09-14 DIAGNOSIS — Z5181 Encounter for therapeutic drug level monitoring: Secondary | ICD-10-CM | POA: Diagnosis not present

## 2021-09-14 LAB — CBC
HCT: 37.6 % (ref 36.0–46.0)
Hemoglobin: 11.6 g/dL — ABNORMAL LOW (ref 12.0–15.0)
MCH: 25.7 pg — ABNORMAL LOW (ref 26.0–34.0)
MCHC: 30.9 g/dL (ref 30.0–36.0)
MCV: 83.4 fL (ref 80.0–100.0)
Platelets: 277 10*3/uL (ref 150–400)
RBC: 4.51 MIL/uL (ref 3.87–5.11)
RDW: 14.3 % (ref 11.5–15.5)
WBC: 3.7 10*3/uL — ABNORMAL LOW (ref 4.0–10.5)
nRBC: 0 % (ref 0.0–0.2)

## 2021-09-14 LAB — COMPREHENSIVE METABOLIC PANEL
ALT: 23 U/L (ref 0–44)
AST: 24 U/L (ref 15–41)
Albumin: 4 g/dL (ref 3.5–5.0)
Alkaline Phosphatase: 94 U/L (ref 38–126)
Anion gap: 7 (ref 5–15)
BUN: 16 mg/dL (ref 6–20)
CO2: 26 mmol/L (ref 22–32)
Calcium: 9.2 mg/dL (ref 8.9–10.3)
Chloride: 105 mmol/L (ref 98–111)
Creatinine, Ser: 0.82 mg/dL (ref 0.44–1.00)
GFR, Estimated: >60 mL/min (ref >60)
Glucose, Bld: 97 mg/dL (ref 70–99)
Potassium: 3.5 mmol/L (ref 3.5–5.1)
Sodium: 138 mmol/L (ref 135–145)
Total Bilirubin: 0.7 mg/dL (ref 0.3–1.2)
Total Protein: 7.9 g/dL (ref 6.5–8.1)

## 2021-09-20 NOTE — Progress Notes (Signed)
? ?[facility] ? ?PATIENT: Carmen Kim ?DOB: [birth year] ? ?REFERRING DOCTOR OR PCP:   Minette Brine FNP ?SOURCE: Patient, notes from primary care, notes from Dr. Rexene Alberts, imaging and lab reports, MRI angiogram personally reviewed. ? ?_________________________________ ? ? ?HISTORICAL ? ?CHIEF COMPLAINT:  ?Chief Complaint  ?Patient presents with  ? New Patient (Initial Visit)  ?  RM 1, alone. Internal referral for fatigue/numb/ting. Right now numb/ting not present. When she has it, located in arms/legs. Sometimes radiates across her back. Sometimes has numbness in face/around mouth. Occurs about 70% of time in extremities.   ? ? ?HISTORY OF PRESENT ILLNESS:  ?I had the pleasure of seeing your patient, Carmen Kim, at [facility] for neurologic consultation regarding her dysesthesias and other symptoms. ? ?She is a 56 year old woman with a burning pain in her arms and hands.   At night, the legs and feet are also involved.   She also notes numbness in her face near the mouth at times.    Symptoms are milder during the day and sometimes she has none.  However, symptoms will worsen at nights.   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  Symptoms slowly came on in early 2023.  She denies weakness but she notes she had some falls even though balance is not poor.   No falls in last few months though.   She is also noting urinary urgency and some leakage over the past year.     She has had OD floaters that are worsening and she sees optometry later this week.   ? ?She has not taken medications.   Symptoms are a little better when she switches positons but then slowly return.  Symptoms are worse laying down at night.       ? ?She denies weakness.   ? ?She had bipolar 2 and sees Dr. Tommi Rumps.  She has been refractory to medications and [place].   ECT is planned to begin (6 sessions over 2 weeks) next week    ? ?MR angiogram 02/03/2020 was normal.  There were no  aneurysms.  No intracranial stenosis. ? ?REVIEW OF SYSTEMS: ?Constitutional: No fevers, chills, sweats, or change in appetite.  She has some fatigue ?Eyes: No visual changes, double vision, eye pain.  Floaters OD ?Ear, nose and throat: No hearing loss, ear pain, nasal congestion, sore throat ?Cardiovascular: No chest pain, palpitations ?Respiratory:  No shortness of breath at rest or with exertion.   No wheezes ?GastrointestinaI: No nausea, vomiting, diarrhea, abdominal pain, fecal incontinence ?Genitourinary:  No dysuria, urinary retention or frequency.  No nocturia. ?Musculoskeletal:  No neck pain, back pain ?Integumentary: No rash, pruritus, skin lesions ?Neurological: as above ?Psychiatric: No depression at this time.  No anxiety ?Endocrine: No palpitations, diaphoresis, change in appetite, change in weigh or increased thirst ?Hematologic/Lymphatic:  No anemia, purpura, petechiae. ?Allergic/Immunologic: No itchy/runny eyes, nasal congestion, recent allergic reactions, rashes ? ?ALLERGIES: ?Allergies  ?Allergen Reactions  ? Wellbutrin [Bupropion] Other (See Comments)  ?  seizures  ? ? ?HOME MEDICATIONS: ? ?Current Outpatient Medications:  ?  aspirin-acetaminophen-caffeine (EXCEDRIN MIGRAINE) 250-250-65 MG tablet, Take by mouth every 6 (six) hours as needed for headache., Disp: , Rfl:  ?  busPIRone (BUSPAR) 10 MG tablet, Take 10 mg by mouth 2 (two) times daily., Disp: , Rfl:  ?  CAPLYTA 42 MG capsule, Take 42 mg by mouth at bedtime., Disp: , Rfl:  ?  desvenlafaxine (PRISTIQ) 100 MG 24  hr tablet, Take 100 mg by mouth daily., Disp: , Rfl:  ?  L-Methylfolate 15 MG TABS, Take 15 mg by mouth daily., Disp: , Rfl:  ?  lamoTRIgine (LAMICTAL) 200 MG tablet, Take 100 mg by mouth at bedtime., Disp: , Rfl:  ?  Magnesium 250 MG TABS, Take 1 tablet (250 mg total) by mouth every evening. Take with evening meal, Disp: 30 tablet, Rfl: 2 ?  Vitamin D, Ergocalciferol, (DRISDOL) 1.25 MG (50000 UNIT) CAPS capsule, TAKE 1 CAPSULE BY  MOUTH 2 TIMES A WEEK, Disp: 24 capsule, Rfl: 1 ? ?PAST MEDICAL HISTORY: ?Past Medical History:  ?Diagnosis Date  ? Anemia   ? Bipolar affect, depressed ([place])   ? Chronic kidney disease   ? mild being monitored  ? Depressed   ? Hemorrhoids   ? IBS (irritable bowel syndrome)   ? Seizures ([place]) 2008  ? from Wellbutrin only  ? ? ?PAST SURGICAL HISTORY: ?Past Surgical History:  ?Procedure Laterality Date  ? ROOT CANAL    ? WISDOM TOOTH EXTRACTION    ? ? ?FAMILY HISTORY: ?Family History  ?Problem Relation Age of Onset  ? Diabetes Mother   ? Hypertension Mother   ? Cerebral aneurysm Mother   ? Colon cancer Neg Hx   ? Esophageal cancer Neg Hx   ? Rectal cancer Neg Hx   ? Stomach cancer Neg Hx   ? Colon polyps Neg Hx   ? ? ?SOCIAL HISTORY: ? ?Social History  ? ?Socioeconomic History  ? Marital status: Single  ?  Spouse name: Not on file  ? Number of children: Not on file  ? Years of education: Not on file  ? Highest education level: Not on file  ?Occupational History  ? Not on file  ?Tobacco Use  ? Smoking status: Never  ? Smokeless tobacco: Never  ?Vaping Use  ? Vaping Use: Never used  ?Substance and Sexual Activity  ? Alcohol use: Yes  ?  Alcohol/week: 1.0 standard drink  ?  Types: 1 Glasses of wine per week  ?  Comment: Rare  ? Drug use: No  ? Sexual activity: Not on file  ?Other Topics Concern  ? Not on file  ?Social History Narrative  ? Right handed  ? No caffeine use:   ? ?Social Determinants of Health  ? ?Financial Resource Strain: Not on file  ?Food Insecurity: Not on file  ?Transportation Needs: Not on file  ?Physical Activity: Not on file  ?Stress: Not on file  ?Social Connections: Not on file  ?Intimate Partner Violence: Not on file  ? ? ? ?PHYSICAL EXAM ? ?Vitals:  ? 09/21/21 1248  ?BP: 116/79  ?Pulse: 95  ?Weight: 241 lb 8 oz (109.5 kg)  ?Height: '5\' 4"'$  (1.626 m)  ? ? ?Body mass index is 41.45 kg/m?. ? ? ?General: The patient is well-developed and well-nourished and in no acute distress ? ?HEENT:  Head is [place]/AT.   Sclera are anicteric.  ? ?Neck: No carotid bruits are noted.  The neck is nontender. ? ?Cardiovascular: The heart has a regular rate and rhythm with a normal S1 and S2. There were no murmurs, gallops or rubs.   ? ?Skin: Extremities are without rash or  edema. ? ?Musculoskeletal:  Back is nontender ? ?Neurologic Exam ? ?Mental status: The patient is alert and oriented x 3 at the time of the examination. The patient has apparent normal recent and remote memory, with an apparently normal attention span and concentration ability.  Speech is normal. ? ?Cranial nerves: Extraocular movements are full. Pupils are equal, round, and reactive to light and accomodation.  Visual fields are full.  Facial symmetry is present. There is good facial sensation to soft touch bilaterally.Facial strength is normal.  Trapezius and sternocleidomastoid strength is normal. No dysarthria is noted.  The tongue is midline, and the patient has symmetric elevation of the soft palate. No obvious hearing deficits are noted. ? ?Motor:  Muscle bulk is normal.   Tone is normal. Strength is  5 / 5 in all 4 extremities.  ? ?Sensory: Sensory testing is intact to pinprick, soft touch and vibration sensation in all 4 extremities. ? ?Coordination: Cerebellar testing reveals good finger-nose-finger and heel-to-shin bilaterally. ? ?Gait and station: Station is normal.   Gait is normal. Tandem gait is normal. Romberg is negative.  ? ?Reflexes: Deep tendon reflexes are symmetric and normal bilaterally.   Plantar responses are flexor. ? ? ? ?DIAGNOSTIC DATA (LABS, IMAGING, TESTING) ?- I reviewed patient records, labs, notes, testing and imaging myself where available. ? ?Lab Results  ?Component Value Date  ? WBC 3.7 (L) 09/14/2021  ? HGB 11.6 (L) 09/14/2021  ? HCT 37.6 09/14/2021  ? MCV 83.4 09/14/2021  ? PLT 277 09/14/2021  ? ?   ?Component Value Date/Time  ? NA 138 09/14/2021 0904  ? NA 143 04/14/2021 1301  ? K 3.5 09/14/2021 0904  ? CL 105 09/14/2021 0904  ?  CO2 26 09/14/2021 0904  ? GLUCOSE 97 09/14/2021 0904  ? BUN 16 09/14/2021 0904  ? BUN 13 04/14/2021 1301  ? CREATININE 0.82 09/14/2021 0904  ? CALCIUM 9.2 09/14/2021 0904  ? PROT 7.9 09/14/2021 0904  ? PROT 7

## 2021-09-21 ENCOUNTER — Encounter: Payer: Self-pay | Admitting: Neurology

## 2021-09-21 ENCOUNTER — Ambulatory Visit (INDEPENDENT_AMBULATORY_CARE_PROVIDER_SITE_OTHER): Payer: Managed Care, Other (non HMO) | Admitting: Neurology

## 2021-09-21 VITALS — BP 116/79 | HR 95 | Ht 64.0 in | Wt 241.5 lb

## 2021-09-21 DIAGNOSIS — H539 Unspecified visual disturbance: Secondary | ICD-10-CM

## 2021-09-21 DIAGNOSIS — W19XXXS Unspecified fall, sequela: Secondary | ICD-10-CM

## 2021-09-21 DIAGNOSIS — R208 Other disturbances of skin sensation: Secondary | ICD-10-CM

## 2021-09-21 DIAGNOSIS — R200 Anesthesia of skin: Secondary | ICD-10-CM

## 2021-09-21 MED ORDER — GABAPENTIN 300 MG PO CAPS: ORAL_CAPSULE | ORAL | 11 refills | Status: DC

## 2021-09-23 ENCOUNTER — Other Ambulatory Visit: Payer: Self-pay | Admitting: Psychiatry

## 2021-09-23 LAB — MULTIPLE MYELOMA PANEL, SERUM
Albumin SerPl Elph-Mcnc: 3.8 g/dL (ref 2.9–4.4)
Albumin/Glob SerPl: 1.2 (ref 0.7–1.7)
Alpha 1: 0.2 g/dL (ref 0.0–0.4)
Alpha2 Glob SerPl Elph-Mcnc: 0.6 g/dL (ref 0.4–1.0)
B-Globulin SerPl Elph-Mcnc: 1.1 g/dL (ref 0.7–1.3)
Gamma Glob SerPl Elph-Mcnc: 1.3 g/dL (ref 0.4–1.8)
Globulin, Total: 3.2 g/dL (ref 2.2–3.9)
IgA/Immunoglobulin A, Serum: 264 mg/dL (ref 87–352)
IgG (Immunoglobin G), Serum: 1496 mg/dL (ref 586–1602)
IgM (Immunoglobulin M), Srm: 21 mg/dL — ABNORMAL LOW (ref 26–217)
Total Protein: 7 g/dL (ref 6.0–8.5)

## 2021-09-23 LAB — RHEUMATOID FACTOR: Rheumatoid fact SerPl-aCnc: <10 IU/mL (ref <14.0)

## 2021-09-23 LAB — SJOGREN'S SYNDROME ANTIBODS(SSA + SSB)
ENA SSA (RO) Ab: 1.6 AI — ABNORMAL HIGH (ref 0.0–0.9)
ENA SSB (LA) Ab: <0.2 AI (ref 0.0–0.9)

## 2021-09-23 LAB — VITAMIN B12: Vitamin B-12: 654 pg/mL (ref 232–1245)

## 2021-09-26 ENCOUNTER — Encounter: Payer: Self-pay | Admitting: Certified Registered Nurse Anesthetist

## 2021-09-26 ENCOUNTER — Encounter
Admission: RE | Admit: 2021-09-26 | Discharge: 2021-09-26 | Disposition: A | Discharge status: Home / Self Care | Payer: Managed Care, Other (non HMO) | Source: Ambulatory Visit | Attending: Psychiatry | Admitting: Psychiatry

## 2021-09-26 ENCOUNTER — Other Ambulatory Visit: Payer: Self-pay | Admitting: *Deleted

## 2021-09-26 ENCOUNTER — Other Ambulatory Visit: Payer: Managed Care, Other (non HMO)

## 2021-09-26 DIAGNOSIS — F332 Major depressive disorder, recurrent severe without psychotic features: Secondary | ICD-10-CM | POA: Diagnosis present

## 2021-09-26 DIAGNOSIS — G6289 Other specified polyneuropathies: Secondary | ICD-10-CM

## 2021-09-26 DIAGNOSIS — F333 Major depressive disorder, recurrent, severe with psychotic symptoms: Secondary | ICD-10-CM | POA: Diagnosis not present

## 2021-09-26 LAB — URINALYSIS, COMPLETE (UACMP) WITH MICROSCOPIC
Bacteria, UA: NONE SEEN
Bilirubin Urine: NEGATIVE
Glucose, UA: NEGATIVE mg/dL
Hgb urine dipstick: NEGATIVE
Ketones, ur: NEGATIVE mg/dL
Leukocytes,Ua: NEGATIVE
Nitrite: NEGATIVE
Protein, ur: NEGATIVE mg/dL
Specific Gravity, Urine: 1.01 (ref 1.005–1.030)
Squamous Epithelial / HPF: NONE SEEN (ref 0–5)
pH: 6 (ref 5.0–8.0)

## 2021-09-26 MED ORDER — SUCCINYLCHOLINE CHLORIDE 200 MG/10ML IV SOSY
PREFILLED_SYRINGE | INTRAVENOUS | Status: DC | PRN
  Administered 2021-09-26: 120 mg via INTRAVENOUS

## 2021-09-26 MED ORDER — MIDAZOLAM HCL 2 MG/2ML IJ SOLN
INTRAMUSCULAR | Status: AC
  Filled 2021-09-26: qty 2

## 2021-09-26 MED ORDER — LABETALOL HCL 5 MG/ML IV SOLN
INTRAVENOUS | Status: DC | PRN
  Administered 2021-09-26 (×2): 10 mg via INTRAVENOUS

## 2021-09-26 MED ORDER — METHOHEXITAL SODIUM 100 MG/10ML IV SOSY
PREFILLED_SYRINGE | INTRAVENOUS | Status: DC | PRN
  Administered 2021-09-26: 100 mg via INTRAVENOUS

## 2021-09-26 MED ORDER — METHOHEXITAL SODIUM 0.5 G IJ SOLR
INTRAMUSCULAR | Status: AC
  Filled 2021-09-26: qty 500

## 2021-09-26 MED ORDER — ONDANSETRON HCL 4 MG/2ML IJ SOLN: 4.0000 mg | Freq: Once | INTRAMUSCULAR | Status: DC | PRN

## 2021-09-26 MED ORDER — SODIUM CHLORIDE 0.9 % IV SOLN: INTRAVENOUS | Status: DC | PRN

## 2021-09-26 MED ORDER — SODIUM CHLORIDE 0.9 % IV SOLN
500.0000 mL | Freq: Once | INTRAVENOUS | Status: AC
  Administered 2021-09-26: 500 mL via INTRAVENOUS

## 2021-09-26 MED ORDER — GLYCOPYRROLATE 0.2 MG/ML IJ SOLN
INTRAMUSCULAR | Status: AC
  Administered 2021-09-26: 0.1 mg via INTRAVENOUS
  Filled 2021-09-26: qty 1

## 2021-09-26 MED ORDER — MIDAZOLAM HCL 2 MG/2ML IJ SOLN
INTRAMUSCULAR | Status: DC | PRN
  Administered 2021-09-26: 2 mg via INTRAVENOUS

## 2021-09-26 MED ORDER — GLYCOPYRROLATE 0.2 MG/ML IJ SOLN: 0.1000 mg | Freq: Once | INTRAMUSCULAR | Status: AC

## 2021-09-26 NOTE — Transfer of Care (Signed)
Immediate Anesthesia Transfer of Care Note ? ?Patient: Alyssamarie Mounsey ? ?Procedure(s) Performed: ECT TX ? ?Patient Location: PACU ? ?Anesthesia Type:General ? ?Level of Consciousness: drowsy ? ?Airway & Oxygen Therapy: Patient Spontanous Breathing and Patient connected to face mask oxygen ? ?Post-op Assessment: Report given to RN and Post -op Vital signs reviewed and stable ? ?Post vital signs: Reviewed and stable ? ?Last Vitals:  ?Vitals Value Taken Time  ?BP 117/88 09/26/21 1238  ?Temp 37.1 ?C 09/26/21 1238  ?Pulse 85 09/26/21 1240  ?Resp 23 09/26/21 1240  ?SpO2 100 % 09/26/21 1240  ?Vitals shown include unvalidated device data. ? ?Last Pain:  ?Vitals:  ? 09/26/21 1128  ?TempSrc:   ?PainSc: 0-No pain  ?   ? ?  ? ?Complications: No notable events documented. ?

## 2021-09-26 NOTE — H&P (Signed)
Carmen Kim is an 56 y.o. female.   ?Chief Complaint: Patient with recurrent severe depression unresponsive to medication treatment has been referred to begin index course of ECT ?HPI: History of bipolar disorder depression ? ?Past Medical History:  ?Diagnosis Date  ? Anemia   ? Bipolar affect, depressed ([place])   ? Chronic kidney disease   ? mild being monitored  ? Depressed   ? Hemorrhoids   ? IBS (irritable bowel syndrome)   ? Seizures ([place]) 2008  ? from Wellbutrin only  ? ? ?Past Surgical History:  ?Procedure Laterality Date  ? ROOT CANAL    ? WISDOM TOOTH EXTRACTION    ? ? ?Family History  ?Problem Relation Age of Onset  ? Diabetes Mother   ? Hypertension Mother   ? Cerebral aneurysm Mother   ? Colon cancer Neg Hx   ? Esophageal cancer Neg Hx   ? Rectal cancer Neg Hx   ? Stomach cancer Neg Hx   ? Colon polyps Neg Hx   ? ?Social History:  reports that she has never smoked. She has never used smokeless tobacco. She reports current alcohol use of about 1.0 standard drink per week. She reports that she does not use drugs. ? ?Allergies:  ?Allergies  ?Allergen Reactions  ? Wellbutrin [Bupropion] Other (See Comments)  ?  seizures  ? ? ?(Not in a hospital admission) ? ? ?Results for orders placed or performed during the hospital encounter of 09/26/21 (from the past 48 hour(s))  ?Urinalysis, Complete w Microscopic Urine, Clean Catch     Status: Abnormal  ? Collection Time: 09/26/21 12:46 PM  ?Result Value Ref Range  ? Color, Urine YELLOW (A) YELLOW  ? APPearance CLEAR (A) CLEAR  ? Specific Gravity, Urine 1.010 1.005 - 1.030  ? pH 6.0 5.0 - 8.0  ? Glucose, UA NEGATIVE NEGATIVE mg/dL  ? Hgb urine dipstick NEGATIVE NEGATIVE  ? Bilirubin Urine NEGATIVE NEGATIVE  ? Ketones, ur NEGATIVE NEGATIVE mg/dL  ? Protein, ur NEGATIVE NEGATIVE mg/dL  ? Nitrite NEGATIVE NEGATIVE  ? Leukocytes,Ua NEGATIVE NEGATIVE  ? WBC, UA 0-5 0 - 5 WBC/hpf  ? Bacteria, UA NONE SEEN NONE SEEN  ? Squamous Epithelial / LPF NONE SEEN 0 - 5  ? Mucus  PRESENT   ? Hyaline Casts, UA PRESENT   ?  Comment: Performed at [facility], [address] [place], [place] [address]  ? ?No results found. ? ?Review of Systems  ?Constitutional: Negative.   ?HENT: Negative.    ?Eyes: Negative.   ?Respiratory: Negative.    ?Cardiovascular: Negative.   ?Gastrointestinal: Negative.   ?Musculoskeletal: Negative.   ?Skin: Negative.   ?Neurological: Negative.   ?Psychiatric/Behavioral: Negative.    ? ?Blood pressure (!) 128/93, pulse 73, temperature 98.7 ?F (37.1 ?C), resp. rate (!) 21, height '5\' 4"'$  (1.626 m), weight 109.5 kg, last menstrual period 04/19/2014, SpO2 99 %. ?Physical Exam ?Constitutional:   ?   Appearance: She is well-developed.  ?HENT:  ?   Head: Normocephalic and atraumatic.  ?Eyes:  ?   Conjunctiva/sclera: Conjunctivae normal.  ?   Pupils: Pupils are equal, round, and reactive to light.  ?Cardiovascular:  ?   Heart sounds: Normal heart sounds.  ?Pulmonary:  ?   Effort: Pulmonary effort is normal.  ?Abdominal:  ?   Palpations: Abdomen is soft.  ?Musculoskeletal:     ?   General: Normal range of motion.  ?   Cervical back: Normal range of motion.  ?Skin: ?  General: Skin is warm and dry.  ?Neurological:  ?   Mental Status: She is alert.  ?Psychiatric:     ?   Attention and Perception: Attention normal.     ?   Mood and Affect: Mood is depressed.     ?   Speech: Speech normal.     ?   Behavior: Behavior is cooperative.     ?   Thought Content: Thought content does not include suicidal ideation.     ?   Cognition and Memory: Cognition normal.  ?  ? ?Assessment/Plan ?Begin index course of ECT today right unilateral treatment regular anesthesia. ? ?Alethia Berthold, MD ?09/26/2021, 2:53 PM ? ? ? ?

## 2021-09-26 NOTE — Anesthesia Preprocedure Evaluation (Signed)
Anesthesia Evaluation  ?Patient identified by MRN, date of birth, ID band ?Patient awake ? ? ? ?Reviewed: ?Allergy & Precautions, NPO status , Patient's Chart, lab work & pertinent test results ? ?History of Anesthesia Complications ?Negative for: history of anesthetic complications ? ?Airway ?Mallampati: II ? ?TM Distance: >3 FB ?Neck ROM: Full ? ? ? Dental ? ?(+) Teeth Intact, Partial Upper ?  ?Pulmonary ?neg pulmonary ROS, neg sleep apnea, neg COPD, Patient abstained from smoking.Not current smoker,  ?  ?Pulmonary exam normal ?breath sounds clear to auscultation ? ? ? ? ? ? Cardiovascular ?Exercise Tolerance: Good ?METS(-) hypertension(-) CAD and (-) Past MI negative cardio ROS ? ?(-) dysrhythmias  ?Rhythm:Regular Rate:Normal ?- Systolic murmurs ? ?  ?Neuro/Psych ?PSYCHIATRIC DISORDERS Depression Bipolar Disorder negative neurological ROS ?   ? GI/Hepatic ?neg GERD  ,(+)  ?  ? (-) substance abuse ? ,   ?Endo/Other  ?neg diabetesMorbid obesity ? Renal/GU ?CRFRenal disease  ? ?  ?Musculoskeletal ? ? Abdominal ?  ?Peds ? Hematology ? ?(+) Blood dyscrasia, anemia ,   ?Anesthesia Other Findings ?Past Medical History: ?No date: Anemia ?No date: Bipolar affect, depressed ([place]) ?No date: Chronic kidney disease ?    Comment:  mild being monitored ?No date: Depressed ?No date: Hemorrhoids ?No date: IBS (irritable bowel syndrome) ?2008: Seizures ([place]) ?    Comment:  from Wellbutrin only ? Reproductive/Obstetrics ? ?  ? ? ? ? ? ? ? ? ? ? ? ? ? ?  ?  ? ? ? ? ? ? ? ? ?Anesthesia Physical ?Anesthesia Plan ? ?ASA: 3 ? ?Anesthesia Plan: General  ? ?Post-op Pain Management:   ? ?Induction: Intravenous ? ?PONV Risk Score and Plan: 3 and Ondansetron and TIVA ? ?Airway Management Planned: Mask ? ?Additional Equipment: None ? ?Intra-op Plan:  ? ?Post-operative Plan:  ? ?Informed Consent: I have reviewed the patients History and Physical, chart, labs and discussed the procedure including the risks,  benefits and alternatives for the proposed anesthesia with the patient or authorized representative who has indicated his/her understanding and acceptance.  ? ? ? ?Dental advisory given ? ?Plan Discussed with: CRNA and Surgeon ? ?Anesthesia Plan Comments: (Discussed risks of anesthesia with patient, including PONV, muscle aches. Rare risks discussed as well, such as cardiorespiratory sequelae, need for airway intervention and its associated risks including lip/dental/eye damage and sore throat, and allergic reactions. Discussed the role of CRNA in patient's perioperative care. Patient understands.)  ? ? ? ? ? ? ?Anesthesia Quick Evaluation ? ?

## 2021-09-26 NOTE — Anesthesia Procedure Notes (Signed)
Procedure Name: General with mask airway ?Date/Time: 09/26/2021 12:20 PM ?Performed by: Jerrye Noble, CRNA ?Pre-anesthesia Checklist: Patient identified, Emergency Drugs available and Suction available ?Patient Re-evaluated:Patient Re-evaluated prior to induction ?Oxygen Delivery Method: Circle system utilized ?Preoxygenation: Pre-oxygenation with 100% oxygen ? ? ? ? ?

## 2021-09-26 NOTE — Procedures (Signed)
ECT SERVICES ?Physician?s Interval Evaluation & Treatment Note ? ?Patient Identification: Carmen Kim ?MRN:  [id number] ?Date of Evaluation:  09/26/2021 ?[place] #: 1 ? ?MADRS:  ? ?MMSE:  ? ?P.E. Findings: ? ?Normal physical exam ? ?Psychiatric Interval Note: ? ?Depressed but lucid and clear no suicidal ideation ? ?Subjective: ? ?Patient is a 56 y.o. female seen for evaluation for Electroconvulsive Therapy. ?Stable depression and anxiety ? ?Treatment Summary: ? ? '[x]'$   Right Unilateral             '[]'$  Bilateral ?  ?% Energy : 0.3 ms 55% ? ? Impedance: 1080 ohms ? ?Seizure Energy Index: 15,428 ?V squared ? ?Postictal Suppression Index: 66% ? ?Seizure Concordance Index: 96% ? ?Medications ? ?Pre Shock: Robinul 0.1 mg Brevital 110 mg succinylcholine 120 mg ? ?Post Shock: Versed 2 mg ? ?Seizure Duration: 50 seconds EMG 99 seconds EEG ? ? ?Comments: ?Continue with next treatment on Wednesday ? ?Lungs: ? ?'[x]'$   Clear to auscultation              ? ?'[]'$  Other:  ? ?Heart: ?  ? '[x]'$   Regular rhythm             '[]'$  irregular rhythm ? ? ? '[x]'$   Previous H&P reviewed, patient examined and there are NO CHANGES              ?  ? '[]'$   Previous H&P reviewed, patient examined and there are changes noted. ? ? ?Alethia Berthold, MD ?4/24/20233:06 PM ? ?  ?

## 2021-09-27 NOTE — Anesthesia Postprocedure Evaluation (Signed)
Anesthesia Post Note ? ?Patient: Carmen Kim ? ?Procedure(s) Performed: ECT TX ? ?Patient location during evaluation: PACU ?Anesthesia Type: General ?Level of consciousness: awake and alert ?Pain management: pain level controlled ?Vital Signs Assessment: post-procedure vital signs reviewed and stable ?Respiratory status: spontaneous breathing, nonlabored ventilation, respiratory function stable and patient connected to nasal cannula oxygen ?Cardiovascular status: blood pressure returned to baseline and stable ?Postop Assessment: no apparent nausea or vomiting ?Anesthetic complications: no ? ? ?No notable events documented. ? ? ?Last Vitals:  ?Vitals:  ? 09/26/21 1320 09/26/21 1330  ?BP: (!) 128/93 125/85  ?Pulse: 73 72  ?Resp: (!) 21 20  ?Temp:  37.1 ?C  ?SpO2: 99%   ?  ?Last Pain:  ?Vitals:  ? 09/26/21 1330  ?TempSrc: Tympanic  ?PainSc: 0-No pain  ? ? ?  ?  ?  ?  ?  ?  ? ?Martha Clan ? ? ? ? ?

## 2021-09-28 ENCOUNTER — Encounter (HOSPITAL_BASED_OUTPATIENT_CLINIC_OR_DEPARTMENT_OTHER)
Admission: RE | Admit: 2021-09-28 | Discharge: 2021-09-28 | Disposition: A | Discharge status: Home / Self Care | Payer: Managed Care, Other (non HMO) | Source: Ambulatory Visit | Attending: Psychiatry | Admitting: Psychiatry

## 2021-09-28 ENCOUNTER — Ambulatory Visit: Payer: Self-pay | Admitting: Certified Registered"

## 2021-09-28 ENCOUNTER — Other Ambulatory Visit: Payer: Self-pay | Admitting: Psychiatry

## 2021-09-28 ENCOUNTER — Telehealth: Payer: Self-pay | Admitting: Neurology

## 2021-09-28 ENCOUNTER — Encounter: Payer: Self-pay | Admitting: Certified Registered"

## 2021-09-28 DIAGNOSIS — F333 Major depressive disorder, recurrent, severe with psychotic symptoms: Secondary | ICD-10-CM | POA: Diagnosis not present

## 2021-09-28 DIAGNOSIS — F332 Major depressive disorder, recurrent severe without psychotic features: Secondary | ICD-10-CM

## 2021-09-28 MED ORDER — SODIUM CHLORIDE 0.9 % IV SOLN: INTRAVENOUS | Status: DC | PRN

## 2021-09-28 MED ORDER — KETOROLAC TROMETHAMINE 30 MG/ML IJ SOLN
30.0000 mg | Freq: Once | INTRAMUSCULAR | Status: AC
Start: 2021-09-28 — End: 2021-09-28

## 2021-09-28 MED ORDER — SUCCINYLCHOLINE CHLORIDE 200 MG/10ML IV SOSY
PREFILLED_SYRINGE | INTRAVENOUS | Status: AC
  Filled 2021-09-28: qty 10

## 2021-09-28 MED ORDER — MIDAZOLAM HCL 2 MG/2ML IJ SOLN: 2.0000 mg | Freq: Once | INTRAMUSCULAR | Status: DC

## 2021-09-28 MED ORDER — SUCCINYLCHOLINE CHLORIDE 200 MG/10ML IV SOSY
PREFILLED_SYRINGE | INTRAVENOUS | Status: AC
Start: 2021-09-28 — End: ?
  Filled 2021-09-28: qty 10

## 2021-09-28 MED ORDER — GLYCOPYRROLATE 0.2 MG/ML IJ SOLN
INTRAMUSCULAR | Status: AC
  Administered 2021-09-28: 0.1 mg via INTRAVENOUS
  Filled 2021-09-28: qty 1

## 2021-09-28 MED ORDER — METHOHEXITAL SODIUM 100 MG/10ML IV SOSY
PREFILLED_SYRINGE | INTRAVENOUS | Status: DC | PRN
  Administered 2021-09-28: 100 mg via INTRAVENOUS

## 2021-09-28 MED ORDER — GLYCOPYRROLATE 0.2 MG/ML IJ SOLN: 0.1000 mg | Freq: Once | INTRAMUSCULAR | Status: AC

## 2021-09-28 MED ORDER — SUCCINYLCHOLINE CHLORIDE 200 MG/10ML IV SOSY
PREFILLED_SYRINGE | INTRAVENOUS | Status: DC | PRN
  Administered 2021-09-28: 150 mg via INTRAVENOUS

## 2021-09-28 MED ORDER — ONDANSETRON HCL 4 MG/2ML IJ SOLN: 4.0000 mg | Freq: Once | INTRAMUSCULAR | Status: DC | PRN

## 2021-09-28 MED ORDER — SODIUM CHLORIDE 0.9 % IV SOLN
500.0000 mL | Freq: Once | INTRAVENOUS | Status: AC
  Administered 2021-09-28: 500 mL via INTRAVENOUS

## 2021-09-28 MED ORDER — KETOROLAC TROMETHAMINE 30 MG/ML IJ SOLN
INTRAMUSCULAR | Status: AC
  Administered 2021-09-28: 30 mg via INTRAVENOUS
  Filled 2021-09-28: qty 1

## 2021-09-28 NOTE — Anesthesia Postprocedure Evaluation (Signed)
Anesthesia Post Note ? ?Patient: Carmen Kim ? ?Procedure(s) Performed: ECT TX ? ?Patient location during evaluation: PACU ?Anesthesia Type: General ?Level of consciousness: awake and alert ?Pain management: pain level controlled ?Vital Signs Assessment: post-procedure vital signs reviewed and stable ?Respiratory status: spontaneous breathing, nonlabored ventilation, respiratory function stable and patient connected to nasal cannula oxygen ?Cardiovascular status: blood pressure returned to baseline and stable ?Postop Assessment: no apparent nausea or vomiting ?Anesthetic complications: no ? ? ?No notable events documented. ? ? ?Last Vitals:  ?Vitals:  ? 09/28/21 1211 09/28/21 1220  ?BP:  137/86  ?Pulse: 76 91  ?Resp: 16 20  ?Temp:    ?SpO2: 99% 98%  ?  ?Last Pain:  ?Vitals:  ? 09/28/21 1220  ?TempSrc:   ?PainSc: 0-No pain  ? ? ?  ?  ?  ?  ?  ?  ? ?[name] ? ? ? ? ?

## 2021-09-28 NOTE — Anesthesia Preprocedure Evaluation (Signed)
Anesthesia Evaluation  ?Patient identified by MRN, date of birth, ID band ?Patient awake ? ? ? ?Reviewed: ?Allergy & Precautions, NPO status , Patient's Chart, lab work & pertinent test results ? ?History of Anesthesia Complications ?Negative for: history of anesthetic complications ? ?Airway ?Mallampati: II ? ?TM Distance: >3 FB ?Neck ROM: Full ? ? ? Dental ? ?(+) Teeth Intact, Partial Upper ?  ?Pulmonary ?neg pulmonary ROS, neg sleep apnea, neg COPD, Patient abstained from smoking.Not current smoker,  ?  ?Pulmonary exam normal ?breath sounds clear to auscultation ? ? ? ? ? ? Cardiovascular ?Exercise Tolerance: Good ?METS(-) hypertension(-) CAD and (-) Past MI negative cardio ROS ? ?(-) dysrhythmias  ?Rhythm:Regular Rate:Normal ?- Systolic murmurs ? ?  ?Neuro/Psych ?PSYCHIATRIC DISORDERS Depression Bipolar Disorder negative neurological ROS ?   ? GI/Hepatic ?neg GERD  ,(+)  ?  ? (-) substance abuse ? ,   ?Endo/Other  ?neg diabetesMorbid obesity ? Renal/GU ?CRFRenal disease  ? ?  ?Musculoskeletal ? ? Abdominal ?  ?Peds ? Hematology ? ?(+) Blood dyscrasia, anemia ,   ?Anesthesia Other Findings ?Past Medical History: ?No date: Anemia ?No date: Bipolar affect, depressed ([place]) ?No date: Chronic kidney disease ?    Comment:  mild being monitored ?No date: Depressed ?No date: Hemorrhoids ?No date: IBS (irritable bowel syndrome) ?2008: Seizures ([place]) ?    Comment:  from Wellbutrin only ? Reproductive/Obstetrics ? ?  ? ? ? ? ? ? ? ? ? ? ? ? ? ?  ?  ? ? ? ? ? ? ? ? ?Anesthesia Physical ? ?Anesthesia Plan ? ?ASA: 3 ? ?Anesthesia Plan: General  ? ?Post-op Pain Management:   ? ?Induction: Intravenous ? ?PONV Risk Score and Plan: 3 and Ondansetron and TIVA ? ?Airway Management Planned: Mask ? ?Additional Equipment: None ? ?Intra-op Plan:  ? ?Post-operative Plan:  ? ?Informed Consent: I have reviewed the patients History and Physical, chart, labs and discussed the procedure including the risks,  benefits and alternatives for the proposed anesthesia with the patient or authorized representative who has indicated his/her understanding and acceptance.  ? ? ? ?Dental advisory given ? ?Plan Discussed with: CRNA and Surgeon ? ?Anesthesia Plan Comments: (Discussed risks of anesthesia with patient, including PONV, muscle aches. Rare risks discussed as well, such as cardiorespiratory sequelae, need for airway intervention and its associated risks including lip/dental/eye damage and sore throat, and allergic reactions. Discussed the role of CRNA in patient's perioperative care. Patient understands. ?Patient informed about increased incidence of above perioperative risk due to high BMI. Patient understands. ?)  ? ? ? ? ? ? ?Anesthesia Quick Evaluation ? ?

## 2021-09-28 NOTE — Transfer of Care (Signed)
Immediate Anesthesia Transfer of Care Note ? ?Patient: Carmen Kim ? ?Procedure(s) Performed: ECT TX ? ?Patient Location: PACU ? ?Anesthesia Type:MAC ? ?Level of Consciousness: drowsy ? ?Airway & Oxygen Therapy: Patient Spontanous Breathing and Patient connected to face mask oxygen ? ?Post-op Assessment: Report given to RN, Post -op Vital signs reviewed and stable and Patient moving all extremities ? ?Post vital signs: Reviewed and stable ? ?Last Vitals:  ?Vitals Value Taken Time  ?BP 156/90 09/28/21 1210  ?Temp 36.3 ?C 09/28/21 1205  ?Pulse 73 09/28/21 1211  ?Resp 20 09/28/21 1211  ?SpO2 98 % 09/28/21 1211  ?Vitals shown include unvalidated device data. ? ?Last Pain:  ?Vitals:  ? 09/28/21 1205  ?TempSrc:   ?PainSc: Asleep  ?   ? ?  ? ?Complications: No notable events documented. ?

## 2021-09-28 NOTE — Procedures (Signed)
ECT SERVICES ?Physician?s Interval Evaluation & Treatment Note ? ?Patient Identification: Carmen Kim ?MRN:  [id number] ?Date of Evaluation:  09/28/2021 ?[place] #: 2 ? ?MADRS:  ? ?MMSE:  ? ?P.E. Findings: ? ?No change to physical exam ? ?Psychiatric Interval Note: ? ?Mood still dysphoric no significant change. ? ?Subjective: ? ?Patient is a 56 y.o. female seen for evaluation for Electroconvulsive Therapy. ?After first treatment has some muscle soreness yesterday transient self-limited no other complaints ? ?Treatment Summary: ? ? '[x]'$   Right Unilateral             '[]'$  Bilateral ?  ?% Energy : 0.3 ms 55% ? ? Impedance: 1310 ohms ? ?Seizure Energy Index: 17,338 ?V squared ? ?Postictal Suppression Index: 90% ? ?Seizure Concordance Index: 96% ? ?Medications ? ?Pre Shock: Brevital 100 mg succinylcholine 150 mg ? ?Post Shock: Versed 2 mg ? ?Seizure Duration: 38 seconds EMG 41 seconds EEG ? ? ?Comments: ?Reminded patient to take over-the-counter pain relief medicine for the soreness.  She is agreeable to continuing treatment.  Treatment today and then follow-up Wednesday continue 3 times a week index course ? ?Lungs: ? ?'[x]'$   Clear to auscultation              ? ?'[]'$  Other:  ? ?Heart: ?  ? '[x]'$   Regular rhythm             '[]'$  irregular rhythm ? ? ? '[x]'$   Previous H&P reviewed, patient examined and there are NO CHANGES              ?  ? '[]'$   Previous H&P reviewed, patient examined and there are changes noted. ? ? ?Alethia Berthold, MD ?4/26/20233:31 PM ? ?  ?

## 2021-09-28 NOTE — Telephone Encounter (Signed)
90 mins MRI Brain w/wo contrast & MRI Cervical w/wo contrast Dr. Corena Pilgrim: [id number] exp. 09/27/21-03/26/22 ? ?Scheduled with patient for 10/04/21 at 12:30PM at [facility]  ?

## 2021-09-28 NOTE — H&P (Signed)
Carmen Kim is an 56 y.o. female.   ?Chief Complaint: soreness yesterday but no other NEW complaint ?HPI: depression beginning index treatment ? ?Past Medical History:  ?Diagnosis Date  ? Anemia   ? Bipolar affect, depressed ([place])   ? Chronic kidney disease   ? mild being monitored  ? Depressed   ? Hemorrhoids   ? IBS (irritable bowel syndrome)   ? Seizures ([place]) 2008  ? from Wellbutrin only  ? ? ?Past Surgical History:  ?Procedure Laterality Date  ? ROOT CANAL    ? WISDOM TOOTH EXTRACTION    ? ? ?Family History  ?Problem Relation Age of Onset  ? Diabetes Mother   ? Hypertension Mother   ? Cerebral aneurysm Mother   ? Colon cancer Neg Hx   ? Esophageal cancer Neg Hx   ? Rectal cancer Neg Hx   ? Stomach cancer Neg Hx   ? Colon polyps Neg Hx   ? ?Social History:  reports that she has never smoked. She has never used smokeless tobacco. She reports current alcohol use of about 1.0 standard drink per week. She reports that she does not use drugs. ? ?Allergies:  ?Allergies  ?Allergen Reactions  ? Wellbutrin [Bupropion] Other (See Comments)  ?  seizures  ? ? ?(Not in a hospital admission) ? ? ?Results for orders placed or performed during the hospital encounter of 09/26/21 (from the past 48 hour(s))  ?Urinalysis, Complete w Microscopic Urine, Clean Catch     Status: Abnormal  ? Collection Time: 09/26/21 12:46 PM  ?Result Value Ref Range  ? Color, Urine YELLOW (A) YELLOW  ? APPearance CLEAR (A) CLEAR  ? Specific Gravity, Urine 1.010 1.005 - 1.030  ? pH 6.0 5.0 - 8.0  ? Glucose, UA NEGATIVE NEGATIVE mg/dL  ? Hgb urine dipstick NEGATIVE NEGATIVE  ? Bilirubin Urine NEGATIVE NEGATIVE  ? Ketones, ur NEGATIVE NEGATIVE mg/dL  ? Protein, ur NEGATIVE NEGATIVE mg/dL  ? Nitrite NEGATIVE NEGATIVE  ? Leukocytes,Ua NEGATIVE NEGATIVE  ? WBC, UA 0-5 0 - 5 WBC/hpf  ? Bacteria, UA NONE SEEN NONE SEEN  ? Squamous Epithelial / LPF NONE SEEN 0 - 5  ? Mucus PRESENT   ? Hyaline Casts, UA PRESENT   ?  Comment: Performed at [facility], [address] [place], [place] [address]  ? ?No results found. ? ?Review of Systems  ?Constitutional: Negative.   ?HENT: Negative.    ?Eyes: Negative.   ?Respiratory: Negative.    ?Cardiovascular: Negative.   ?Gastrointestinal: Negative.   ?Musculoskeletal: Negative.   ?Skin: Negative.   ?Neurological: Negative.   ?Psychiatric/Behavioral: Negative.    ? ?Blood pressure (!) 132/93, pulse 79, temperature 98.7 ?F (37.1 ?C), temperature source Tympanic, resp. rate 16, height '5\' 4"'$  (1.626 m), weight 109.5 kg, last menstrual period 04/19/2014, SpO2 97 %. ?Physical Exam ?Vitals and nursing note reviewed.  ?Constitutional:   ?   Appearance: She is well-developed.  ?HENT:  ?   Head: Normocephalic and atraumatic.  ?Eyes:  ?   Conjunctiva/sclera: Conjunctivae normal.  ?   Pupils: Pupils are equal, round, and reactive to light.  ?Cardiovascular:  ?   Heart sounds: Normal heart sounds.  ?Pulmonary:  ?   Effort: Pulmonary effort is normal.  ?Abdominal:  ?   Palpations: Abdomen is soft.  ?Musculoskeletal:     ?   General: Normal range of motion.  ?   Cervical back: Normal range of motion.  ?Skin: ?   General: Skin is  warm and dry.  ?Neurological:  ?   General: No focal deficit present.  ?   Mental Status: She is alert.  ?Psychiatric:     ?   Attention and Perception: Attention normal.     ?   Mood and Affect: Mood is depressed.     ?   Speech: Speech normal.     ?   Behavior: Behavior is cooperative.     ?   Thought Content: Thought content does not include suicidal ideation.     ?   Cognition and Memory: Cognition normal.  ?  ? ?Assessment/Plan ?Continue 3x week ? ?Alethia Berthold, MD ?09/28/2021, 11:16 AM ? ? ? ?

## 2021-09-29 ENCOUNTER — Other Ambulatory Visit: Payer: Self-pay | Admitting: Psychiatry

## 2021-09-30 ENCOUNTER — Encounter (HOSPITAL_BASED_OUTPATIENT_CLINIC_OR_DEPARTMENT_OTHER)
Admission: RE | Admit: 2021-09-30 | Discharge: 2021-09-30 | Disposition: A | Discharge status: Home / Self Care | Payer: Managed Care, Other (non HMO) | Source: Ambulatory Visit | Attending: Psychiatry | Admitting: Psychiatry

## 2021-09-30 ENCOUNTER — Encounter: Payer: Self-pay | Admitting: Anesthesiology

## 2021-09-30 ENCOUNTER — Ambulatory Visit: Payer: Self-pay | Admitting: Anesthesiology

## 2021-09-30 DIAGNOSIS — F332 Major depressive disorder, recurrent severe without psychotic features: Secondary | ICD-10-CM | POA: Diagnosis not present

## 2021-09-30 DIAGNOSIS — F333 Major depressive disorder, recurrent, severe with psychotic symptoms: Secondary | ICD-10-CM | POA: Diagnosis not present

## 2021-09-30 MED ORDER — METHOHEXITAL SODIUM 100 MG/10ML IV SOSY
PREFILLED_SYRINGE | INTRAVENOUS | Status: DC | PRN
  Administered 2021-09-30: 100 mg via INTRAVENOUS

## 2021-09-30 MED ORDER — KETOROLAC TROMETHAMINE 30 MG/ML IJ SOLN: 30.0000 mg | Freq: Once | INTRAMUSCULAR | Status: AC

## 2021-09-30 MED ORDER — SODIUM CHLORIDE 0.9 % IV SOLN
500.0000 mL | Freq: Once | INTRAVENOUS | Status: AC
  Administered 2021-09-30: 500 mL via INTRAVENOUS

## 2021-09-30 MED ORDER — LABETALOL HCL 5 MG/ML IV SOLN
INTRAVENOUS | Status: DC | PRN
  Administered 2021-09-30: 10 mg via INTRAVENOUS

## 2021-09-30 MED ORDER — GLYCOPYRROLATE 0.2 MG/ML IJ SOLN: 0.2000 mg | Freq: Once | INTRAMUSCULAR | Status: AC

## 2021-09-30 MED ORDER — SODIUM CHLORIDE 0.9 % IV SOLN: INTRAVENOUS | Status: DC | PRN

## 2021-09-30 MED ORDER — MIDAZOLAM HCL 2 MG/2ML IJ SOLN: 2.0000 mg | Freq: Once | INTRAMUSCULAR | Status: DC

## 2021-09-30 MED ORDER — KETOROLAC TROMETHAMINE 30 MG/ML IJ SOLN
INTRAMUSCULAR | Status: AC
  Administered 2021-09-30: 30 mg via INTRAVENOUS
  Filled 2021-09-30: qty 1

## 2021-09-30 MED ORDER — GLYCOPYRROLATE 0.2 MG/ML IJ SOLN
INTRAMUSCULAR | Status: AC
  Administered 2021-09-30: 0.2 mg via INTRAVENOUS
  Filled 2021-09-30: qty 1

## 2021-09-30 MED ORDER — SUCCINYLCHOLINE CHLORIDE 200 MG/10ML IV SOSY
PREFILLED_SYRINGE | INTRAVENOUS | Status: DC | PRN
  Administered 2021-09-30: 150 mg via INTRAVENOUS

## 2021-09-30 NOTE — H&P (Signed)
Carmen Kim is an 56 y.o. female.   ?Chief Complaint: Patient was no new complaint.  Minor soreness after the treatments.  Sleepy the last couple days.  Mood not necessarily better but no worse ?HPI: Severe recurrent depression now receiving ECT ? ?Past Medical History:  ?Diagnosis Date  ? Anemia   ? Bipolar affect, depressed ([place])   ? Chronic kidney disease   ? mild being monitored  ? Depressed   ? Hemorrhoids   ? IBS (irritable bowel syndrome)   ? Seizures ([place]) 2008  ? from Wellbutrin only  ? ? ?Past Surgical History:  ?Procedure Laterality Date  ? ROOT CANAL    ? WISDOM TOOTH EXTRACTION    ? ? ?Family History  ?Problem Relation Age of Onset  ? Diabetes Mother   ? Hypertension Mother   ? Cerebral aneurysm Mother   ? Colon cancer Neg Hx   ? Esophageal cancer Neg Hx   ? Rectal cancer Neg Hx   ? Stomach cancer Neg Hx   ? Colon polyps Neg Hx   ? ?Social History:  reports that she has never smoked. She has never used smokeless tobacco. She reports current alcohol use of about 1.0 standard drink per week. She reports that she does not use drugs. ? ?Allergies:  ?Allergies  ?Allergen Reactions  ? Wellbutrin [Bupropion] Other (See Comments)  ?  seizures  ? ? ?(Not in a hospital admission) ? ? ?No results found for this or any previous visit (from the past 48 hour(s)). ?No results found. ? ?Review of Systems  ?Constitutional: Negative.   ?HENT: Negative.    ?Eyes: Negative.   ?Respiratory: Negative.    ?Cardiovascular: Negative.   ?Gastrointestinal: Negative.   ?Musculoskeletal: Negative.   ?Skin: Negative.   ?Neurological: Negative.   ?Psychiatric/Behavioral: Negative.    ? ?Blood pressure (!) 118/91, pulse 80, temperature 98.2 ?F (36.8 ?C), temperature source Tympanic, resp. rate 16, height '5\' 4"'$  (1.626 m), weight 109.5 kg, last menstrual period 04/19/2014, SpO2 100 %. ?Physical Exam ?Vitals and nursing note reviewed.  ?Constitutional:   ?   Appearance: She is well-developed.  ?HENT:  ?   Head: Normocephalic and  atraumatic.  ?Eyes:  ?   Conjunctiva/sclera: Conjunctivae normal.  ?   Pupils: Pupils are equal, round, and reactive to light.  ?Cardiovascular:  ?   Heart sounds: Normal heart sounds.  ?Pulmonary:  ?   Effort: Pulmonary effort is normal.  ?Abdominal:  ?   Palpations: Abdomen is soft.  ?Musculoskeletal:     ?   General: Normal range of motion.  ?   Cervical back: Normal range of motion.  ?Skin: ?   General: Skin is warm and dry.  ?Neurological:  ?   General: No focal deficit present.  ?   Mental Status: She is alert.  ?Psychiatric:     ?   Mood and Affect: Mood normal.     ?   Thought Content: Thought content normal.  ?  ? ?Assessment/Plan ?Today is treatment #3.  Encourage patient to monitor herself for mood changes.  Next treatment Monday ? ?Alethia Berthold, MD ?09/30/2021, 1:44 PM ? ? ? ?

## 2021-09-30 NOTE — Transfer of Care (Signed)
Immediate Anesthesia Transfer of Care Note ? ?Patient: Carmen Kim ? ?Procedure(s) Performed: ECT TX ? ?Patient Location: PACU ? ?Anesthesia Type:General ? ?Level of Consciousness: drowsy ? ?Airway & Oxygen Therapy: Patient Spontanous Breathing ? ?Post-op Assessment: Report given to RN and Post -op Vital signs reviewed and stable ? ?Post vital signs: Reviewed and stable ? ?Last Vitals:  ?Vitals Value Taken Time  ?BP    ?Temp    ?Pulse    ?Resp    ?SpO2    ? ? ?Last Pain:  ?Vitals:  ? 09/30/21 1008  ?TempSrc:   ?PainSc: 0-No pain  ?   ? ?  ? ?Complications: No notable events documented. ?

## 2021-09-30 NOTE — Anesthesia Preprocedure Evaluation (Signed)
Anesthesia Evaluation  ?Patient identified by MRN, date of birth, ID band ?Patient awake ? ? ? ?Reviewed: ?Allergy & Precautions, NPO status , Patient's Chart, lab work & pertinent test results ? ?History of Anesthesia Complications ?Negative for: history of anesthetic complications ? ?Airway ?Mallampati: II ? ?TM Distance: >3 FB ?Neck ROM: Full ? ? ? Dental ? ?(+) Teeth Intact, Partial Upper ?  ?Pulmonary ?neg pulmonary ROS, neg sleep apnea, neg COPD, Patient abstained from smoking.Not current smoker,  ?  ?Pulmonary exam normal ?breath sounds clear to auscultation ? ? ? ? ? ? Cardiovascular ?Exercise Tolerance: Good ?METS(-) hypertension(-) CAD and (-) Past MI negative cardio ROS ? ?(-) dysrhythmias  ?Rhythm:Regular Rate:Normal ?- Systolic murmurs ? ?  ?Neuro/Psych ?PSYCHIATRIC DISORDERS Depression Bipolar Disorder negative neurological ROS ?   ? GI/Hepatic ?neg GERD  ,(+)  ?  ? (-) substance abuse ? ,   ?Endo/Other  ?neg diabetesMorbid obesity ? Renal/GU ?CRFRenal disease  ? ?  ?Musculoskeletal ? ? Abdominal ?(+) + obese,   ?Peds ? Hematology ? ?(+) Blood dyscrasia, anemia ,   ?Anesthesia Other Findings ?Past Medical History: ?No date: Anemia ?No date: Bipolar affect, depressed ([place]) ?No date: Chronic kidney disease ?    Comment:  mild being monitored ?No date: Depressed ?No date: Hemorrhoids ?No date: IBS (irritable bowel syndrome) ?2008: Seizures ([place]) ?    Comment:  from Wellbutrin only ? Reproductive/Obstetrics ? ?  ? ? ? ? ? ? ? ? ? ? ? ? ? ?  ?  ? ? ? ? ? ? ? ? ?Anesthesia Physical ? ?Anesthesia Plan ? ?ASA: 3 ? ?Anesthesia Plan: General  ? ?Post-op Pain Management:   ? ?Induction: Intravenous ? ?PONV Risk Score and Plan: 3 and Ondansetron and TIVA ? ?Airway Management Planned: Mask ? ?Additional Equipment: None ? ?Intra-op Plan:  ? ?Post-operative Plan:  ? ?Informed Consent: I have reviewed the patients History and Physical, chart, labs and discussed the procedure including  the risks, benefits and alternatives for the proposed anesthesia with the patient or authorized representative who has indicated his/her understanding and acceptance.  ? ? ? ?Dental advisory given ? ?Plan Discussed with: CRNA and Surgeon ? ?Anesthesia Plan Comments: (Discussed risks of anesthesia with patient, including PONV, muscle aches. Rare risks discussed as well, such as cardiorespiratory sequelae, need for airway intervention and its associated risks including lip/dental/eye damage and sore throat, and allergic reactions. Discussed the role of CRNA in patient's perioperative care. Patient understands. ?Patient informed about increased incidence of above perioperative risk due to high BMI. Patient understands. ?)  ? ? ? ? ? ? ?Anesthesia Quick Evaluation ? ?

## 2021-09-30 NOTE — Procedures (Signed)
ECT SERVICES ?Physician?s Interval Evaluation & Treatment Note ? ?Patient Identification: Carmen Kim ?MRN:  [id number] ?Date of Evaluation:  09/30/2021 ?[place] #: 3 ? ?MADRS:  ? ?MMSE:  ? ?P.E. Findings: ? ?No change to physical exam ? ?Psychiatric Interval Note: ? ?No change to current psychiatric condition ? ?Subjective: ? ?Patient is a 56 y.o. female seen for evaluation for Electroconvulsive Therapy. ?Mild to moderate depression and chronic anxiety ? ?Treatment Summary: ? ? '[x]'$   Right Unilateral             '[]'$  Bilateral ?  ?% Energy : 0.3 ms 55% ? ? Impedance: 1070 ohms ? ?Seizure Energy Index: 21,962 ?V squared ? ?Postictal Suppression Index: 86% ? ?Seizure Concordance Index: 97% ? ?Medications ? ?Pre Shock: Brevital 100 mg succinylcholine 150 mg Robinul 0.2 mg Toradol 30 mg ? ?Post Shock: Versed 2 mg ? ?Seizure Duration: EMG 35 seconds EEG 45 seconds ? ? ?Comments: ?Follow-up Monday ? ?Lungs: ? ?'[x]'$   Clear to auscultation              ? ?'[]'$  Other:  ? ?Heart: ?  ? '[x]'$   Regular rhythm             '[]'$  irregular rhythm ? ? ? '[x]'$   Previous H&P reviewed, patient examined and there are NO CHANGES              ?  ? '[]'$   Previous H&P reviewed, patient examined and there are changes noted. ? ? ?Alethia Berthold, MD ?4/28/20231:45 PM ? ? ? ?

## 2021-10-03 NOTE — Anesthesia Postprocedure Evaluation (Signed)
Anesthesia Post Note ? ?Patient: Carmen Kim ? ?Procedure(s) Performed: ECT TX ? ?Patient location during evaluation: PACU ?Anesthesia Type: General ?Level of consciousness: awake and alert ?Pain management: pain level controlled ?Vital Signs Assessment: post-procedure vital signs reviewed and stable ?Respiratory status: spontaneous breathing, nonlabored ventilation and respiratory function stable ?Cardiovascular status: blood pressure returned to baseline and stable ?Postop Assessment: no apparent nausea or vomiting ?Anesthetic complications: no ? ? ?No notable events documented. ? ? ?Last Vitals:  ?Vitals:  ? 09/30/21 1205 09/30/21 1218  ?BP: 128/76 (!) 118/91  ?Pulse: 79 80  ?Resp: 15 16  ?Temp: 36.8 ?C 36.8 ?C  ?SpO2: 100%   ?  ?Last Pain:  ?Vitals:  ? 09/30/21 1218  ?TempSrc: Tympanic  ?PainSc: 0-No pain  ? ? ?  ?  ?  ?  ?  ?  ? ?[place] Ouch ? ? ? ? ?

## 2021-10-04 ENCOUNTER — Other Ambulatory Visit: Payer: Self-pay | Admitting: Psychiatry

## 2021-10-04 ENCOUNTER — Other Ambulatory Visit: Payer: Managed Care, Other (non HMO)

## 2021-10-05 ENCOUNTER — Encounter: Payer: Self-pay | Admitting: Certified Registered"

## 2021-10-05 ENCOUNTER — Encounter
Admission: RE | Admit: 2021-10-05 | Discharge: 2021-10-05 | Disposition: A | Discharge status: Home / Self Care | Payer: Managed Care, Other (non HMO) | Source: Ambulatory Visit | Attending: Psychiatry | Admitting: Psychiatry

## 2021-10-05 DIAGNOSIS — F332 Major depressive disorder, recurrent severe without psychotic features: Secondary | ICD-10-CM | POA: Diagnosis not present

## 2021-10-05 DIAGNOSIS — F314 Bipolar disorder, current episode depressed, severe, without psychotic features: Secondary | ICD-10-CM | POA: Diagnosis present

## 2021-10-05 MED ORDER — GLYCOPYRROLATE 0.2 MG/ML IJ SOLN
0.2000 mg | Freq: Once | INTRAMUSCULAR | Status: AC
  Administered 2021-10-05: 0.2 mg via INTRAVENOUS

## 2021-10-05 MED ORDER — GLYCOPYRROLATE 0.2 MG/ML IJ SOLN
INTRAMUSCULAR | Status: AC
  Filled 2021-10-05: qty 1

## 2021-10-05 MED ORDER — MIDAZOLAM HCL 2 MG/2ML IJ SOLN
INTRAMUSCULAR | Status: AC
  Filled 2021-10-05: qty 2

## 2021-10-05 MED ORDER — KETOROLAC TROMETHAMINE 30 MG/ML IJ SOLN
INTRAMUSCULAR | Status: AC
  Administered 2021-10-05: 30 mg via INTRAVENOUS
  Filled 2021-10-05: qty 1

## 2021-10-05 MED ORDER — MIDAZOLAM HCL 2 MG/2ML IJ SOLN
2.0000 mg | Freq: Once | INTRAMUSCULAR | Status: AC
  Administered 2021-10-05: 2 mg via INTRAVENOUS

## 2021-10-05 MED ORDER — ONDANSETRON HCL 4 MG/2ML IJ SOLN: 4.0000 mg | Freq: Once | INTRAMUSCULAR | Status: DC | PRN

## 2021-10-05 MED ORDER — SODIUM CHLORIDE 0.9 % IV SOLN: INTRAVENOUS | Status: DC | PRN

## 2021-10-05 MED ORDER — FENTANYL CITRATE (PF) 100 MCG/2ML IJ SOLN: 25.0000 ug | INTRAMUSCULAR | Status: DC | PRN

## 2021-10-05 MED ORDER — LABETALOL HCL 5 MG/ML IV SOLN
INTRAVENOUS | Status: DC | PRN
  Administered 2021-10-05: 10 mg via INTRAVENOUS

## 2021-10-05 MED ORDER — METHOHEXITAL SODIUM 100 MG/10ML IV SOSY
PREFILLED_SYRINGE | INTRAVENOUS | Status: DC | PRN
  Administered 2021-10-05: 100 mg via INTRAVENOUS

## 2021-10-05 MED ORDER — KETOROLAC TROMETHAMINE 30 MG/ML IJ SOLN
30.0000 mg | Freq: Once | INTRAMUSCULAR | Status: AC
Start: 2021-10-05 — End: 2021-10-05

## 2021-10-05 MED ORDER — SODIUM CHLORIDE 0.9 % IV SOLN
500.0000 mL | Freq: Once | INTRAVENOUS | Status: AC
  Administered 2021-10-05: 500 mL via INTRAVENOUS

## 2021-10-05 MED ORDER — SUCCINYLCHOLINE CHLORIDE 200 MG/10ML IV SOSY
PREFILLED_SYRINGE | INTRAVENOUS | Status: DC | PRN
  Administered 2021-10-05: 150 mg via INTRAVENOUS

## 2021-10-05 NOTE — Transfer of Care (Signed)
Immediate Anesthesia Transfer of Care Note ? ?Patient: Carmen Kim ? ?Procedure(s) Performed: ECT TX ? ?Patient Location: PACU ? ?Anesthesia Type:General ? ?Level of Consciousness: awake, drowsy and patient cooperative ? ?Airway & Oxygen Therapy: Patient Spontanous Breathing and Patient connected to face mask oxygen ? ?Post-op Assessment: Report given to RN and Post -op Vital signs reviewed and stable ? ?Post vital signs: Reviewed and stable ? ?Last Vitals:  ?Vitals Value Taken Time  ?BP 108/81 10/05/21 1311  ?Temp 37.2 ?C 10/05/21 1311  ?Pulse 86 10/05/21 1314  ?Resp 23 10/05/21 1314  ?SpO2 98 % 10/05/21 1314  ?Vitals shown include unvalidated device data. ? ?Last Pain:  ?Vitals:  ? 10/05/21 1311  ?TempSrc:   ?PainSc: Asleep  ?   ? ?  ? ?Complications: No notable events documented. ?

## 2021-10-05 NOTE — H&P (Signed)
Carmen Kim is an 56 y.o. female.   ?Chief Complaint: Patient's chief complaint is depressed mood and lack of motivation.  No suicidal ideation and no intention of hurting herself ?HPI: Recurrent depression and anxiety ? ?Past Medical History:  ?Diagnosis Date  ? Anemia   ? Bipolar affect, depressed ([place])   ? Chronic kidney disease   ? mild being monitored  ? Depressed   ? Hemorrhoids   ? IBS (irritable bowel syndrome)   ? Seizures ([place]) 2008  ? from Wellbutrin only  ? ? ?Past Surgical History:  ?Procedure Laterality Date  ? ROOT CANAL    ? WISDOM TOOTH EXTRACTION    ? ? ?Family History  ?Problem Relation Age of Onset  ? Diabetes Mother   ? Hypertension Mother   ? Cerebral aneurysm Mother   ? Colon cancer Neg Hx   ? Esophageal cancer Neg Hx   ? Rectal cancer Neg Hx   ? Stomach cancer Neg Hx   ? Colon polyps Neg Hx   ? ?Social History:  reports that she has never smoked. She has never used smokeless tobacco. She reports current alcohol use of about 1.0 standard drink per week. She reports that she does not use drugs. ? ?Allergies:  ?Allergies  ?Allergen Reactions  ? Wellbutrin [Bupropion] Other (See Comments)  ?  seizures  ? ? ?(Not in a hospital admission) ? ? ?No results found for this or any previous visit (from the past 48 hour(s)). ?No results found. ? ?Review of Systems  ?Constitutional: Negative.   ?HENT: Negative.    ?Eyes: Negative.   ?Respiratory: Negative.    ?Cardiovascular: Negative.   ?Gastrointestinal: Negative.   ?Musculoskeletal: Negative.   ?Skin: Negative.   ?Neurological: Negative.   ?Hematological: Negative.   ?Psychiatric/Behavioral:  Positive for dysphoric mood.   ? ?Blood pressure 131/89, pulse 81, temperature 98.1 ?F (36.7 ?C), resp. rate 17, height '5\' 4"'$  (1.626 m), weight 109.5 kg, last menstrual period 04/19/2014, SpO2 95 %. ?Physical Exam ?Vitals and nursing note reviewed.  ?Constitutional:   ?   Appearance: She is well-developed.  ?HENT:  ?   Head: Normocephalic and atraumatic.  ?Eyes:   ?   Conjunctiva/sclera: Conjunctivae normal.  ?   Pupils: Pupils are equal, round, and reactive to light.  ?Cardiovascular:  ?   Heart sounds: Normal heart sounds.  ?Pulmonary:  ?   Effort: Pulmonary effort is normal.  ?Abdominal:  ?   Palpations: Abdomen is soft.  ?Musculoskeletal:     ?   General: Normal range of motion.  ?   Cervical back: Normal range of motion.  ?Skin: ?   General: Skin is warm and dry.  ?Neurological:  ?   General: No focal deficit present.  ?   Mental Status: She is alert.  ?Psychiatric:     ?   Attention and Perception: Attention normal.     ?   Mood and Affect: Mood is anxious and depressed.     ?   Speech: Speech normal.     ?   Behavior: Behavior is cooperative.     ?   Thought Content: Thought content normal.     ?   Cognition and Memory: Cognition normal.  ?  ? ?Assessment/Plan ?Treatment today is #4.  We will continue through this week.  Encourage patient to be monitoring any changes in mood or motivation which are her target symptoms ? ?Alethia Berthold, MD ?10/05/2021, 2:36 PM ? ? ? ?

## 2021-10-05 NOTE — Anesthesia Procedure Notes (Signed)
Procedure Name: General with mask airway ?Date/Time: 10/05/2021 1:16 PM ?Performed by: Jonna Clark, CRNA ?Pre-anesthesia Checklist: Patient identified, Emergency Drugs available, Suction available, Patient being monitored and Timeout performed ?Patient Re-evaluated:Patient Re-evaluated prior to induction ?Oxygen Delivery Method: Circle system utilized ?Preoxygenation: Pre-oxygenation with 100% oxygen ?Induction Type: IV induction ?Ventilation: Mask ventilation without difficulty ?Placement Confirmation: breath sounds checked- equal and bilateral, positive ETCO2 and CO2 detector ?Dental Injury: Teeth and Oropharynx as per pre-operative assessment  ? ? ? ? ?

## 2021-10-05 NOTE — Anesthesia Postprocedure Evaluation (Signed)
Anesthesia Post Note ? ?Patient: Carmen Kim ? ?Procedure(s) Performed: ECT TX ? ?Patient location during evaluation: PACU ?Anesthesia Type: General ?Level of consciousness: awake and oriented ?Pain management: satisfactory to patient ?Vital Signs Assessment: post-procedure vital signs reviewed and stable ?Respiratory status: respiratory function stable and nonlabored ventilation ?Cardiovascular status: stable ?Anesthetic complications: no ? ? ?No notable events documented. ? ? ?Last Vitals:  ?Vitals:  ? 10/05/21 1050 10/05/21 1311  ?BP: 108/74 108/81  ?Pulse: 89 82  ?Resp: 16 18  ?Temp: 36.6 ?C 37.2 ?C  ?SpO2: 96% 97%  ?  ?Last Pain:  ?Vitals:  ? 10/05/21 1311  ?TempSrc:   ?PainSc: Asleep  ? ? ?  ?  ?  ?  ?  ?  ? ?VAN STAVEREN,[name] ? ? ? ? ?

## 2021-10-05 NOTE — Procedures (Signed)
ECT SERVICES ?Physician?s Interval Evaluation & Treatment Note ? ?Patient Identification: Carmen Kim ?MRN:  [id number] ?Date of Evaluation:  10/05/2021 ?[place] #: 4 ? ?MADRS:  ? ?MMSE:  ? ?P.E. Findings: ? ?No change to physical exam ? ?Psychiatric Interval Note: ? ?Continues to report low motivation and low mood but no suicidal thought no psychosis ? ?Subjective: ? ?Patient is a 56 y.o. female seen for evaluation for Electroconvulsive Therapy. ?See above ? ?Treatment Summary: ? ? '[x]'$   Right Unilateral             '[]'$  Bilateral ?  ?% Energy : 0.3 ms 55% ? ? Impedance: 1040 ohms ? ?Seizure Energy Index: 17,765 ?V squared ? ?Postictal Suppression Index: 23% ? ?Seizure Concordance Index: 98% ? ?Medications ? ?Pre Shock: Brevital 100 mg succinylcholine 150 mg ? ?Post Shock:   ? ?Seizure Duration: 35 seconds EMG 62 seconds EEG ? ? ?Comments: ?Follow-up Friday ? ?Lungs: ? ?'[x]'$   Clear to auscultation              ? ?'[]'$  Other:  ? ?Heart: ?  ? '[x]'$   Regular rhythm             '[]'$  irregular rhythm ? ? ? '[x]'$   Previous H&P reviewed, patient examined and there are NO CHANGES              ?  ? '[]'$   Previous H&P reviewed, patient examined and there are changes noted. ? ? ?Alethia Berthold, MD ?5/3/20232:37 PM ? ?  ?

## 2021-10-05 NOTE — Anesthesia Preprocedure Evaluation (Signed)
Anesthesia Evaluation  ?Patient identified by MRN, date of birth, ID band ?Patient awake ? ? ? ?Reviewed: ?Allergy & Precautions, NPO status , Patient's Chart, lab work & pertinent test results ? ?Airway ?Mallampati: II ? ?TM Distance: >3 FB ?Neck ROM: Full ? ? ? Dental ? ?(+) Loose, Caps,  ?  ?Pulmonary ?neg pulmonary ROS,  ?  ?Pulmonary exam normal ?breath sounds clear to auscultation ? ? ? ? ? ? Cardiovascular ?Exercise Tolerance: Good ?negative cardio ROS ?Normal cardiovascular exam ?Rhythm:Regular Rate:Normal ? ? ?  ?Neuro/Psych ?Seizures -, Well Controlled,  Depression Bipolar Disorder negative neurological ROS ? negative psych ROS  ? GI/Hepatic ?negative GI ROS, Neg liver ROS,   ?Endo/Other  ?negative endocrine ROS ? Renal/GU ?negative Renal ROS  ?negative genitourinary ?  ?Musculoskeletal ?negative musculoskeletal ROS ?(+)  ? Abdominal ?(+) + obese,   ?Peds ?negative pediatric ROS ?(+)  Hematology ?negative hematology ROS ?(+) Blood dyscrasia, anemia ,   ?Anesthesia Other Findings ?Past Medical History: ?No date: Anemia ?No date: Bipolar affect, depressed ([place]) ?No date: Chronic kidney disease ?    Comment:  mild being monitored ?No date: Depressed ?No date: Hemorrhoids ?No date: IBS (irritable bowel syndrome) ?2008: Seizures ([place]) ?    Comment:  from Wellbutrin only ? ?Past Surgical History: ?No date: ROOT CANAL ?No date: WISDOM TOOTH EXTRACTION ? ?BMI   ? Body Mass Index: 41.45 kg/m?  ?  ? ? Reproductive/Obstetrics ?negative OB ROS ? ?  ? ? ? ? ? ? ? ? ? ? ? ? ? ?  ?  ? ? ? ? ? ? ? ? ?Anesthesia Physical ?Anesthesia Plan ? ?ASA: 3 ? ?Anesthesia Plan: General  ? ?Post-op Pain Management:   ? ?Induction: Intravenous ? ?PONV Risk Score and Plan: Propofol infusion and TIVA ? ?Airway Management Planned: Natural Airway and Nasal Cannula ? ?Additional Equipment:  ? ?Intra-op Plan:  ? ?Post-operative Plan:  ? ?Informed Consent: I have reviewed the patients History and Physical,  chart, labs and discussed the procedure including the risks, benefits and alternatives for the proposed anesthesia with the patient or authorized representative who has indicated his/her understanding and acceptance.  ? ? ? ?Dental Advisory Given ? ?Plan Discussed with: CRNA and Surgeon ? ?Anesthesia Plan Comments:   ? ? ? ? ? ? ?Anesthesia Quick Evaluation ? ?

## 2021-10-06 ENCOUNTER — Other Ambulatory Visit: Payer: Self-pay | Admitting: Psychiatry

## 2021-10-07 ENCOUNTER — Encounter (HOSPITAL_BASED_OUTPATIENT_CLINIC_OR_DEPARTMENT_OTHER)
Admission: RE | Admit: 2021-10-07 | Discharge: 2021-10-07 | Disposition: A | Discharge status: Home / Self Care | Payer: Managed Care, Other (non HMO) | Source: Ambulatory Visit | Attending: Psychiatry | Admitting: Psychiatry

## 2021-10-07 ENCOUNTER — Ambulatory Visit: Payer: Self-pay | Admitting: Certified Registered"

## 2021-10-07 ENCOUNTER — Encounter: Payer: Self-pay | Admitting: Certified Registered"

## 2021-10-07 DIAGNOSIS — F314 Bipolar disorder, current episode depressed, severe, without psychotic features: Secondary | ICD-10-CM | POA: Diagnosis not present

## 2021-10-07 DIAGNOSIS — F332 Major depressive disorder, recurrent severe without psychotic features: Secondary | ICD-10-CM

## 2021-10-07 MED ORDER — LABETALOL HCL 5 MG/ML IV SOLN
INTRAVENOUS | Status: DC | PRN
  Administered 2021-10-07: 10 mg via INTRAVENOUS

## 2021-10-07 MED ORDER — SUCCINYLCHOLINE CHLORIDE 200 MG/10ML IV SOSY
PREFILLED_SYRINGE | INTRAVENOUS | Status: DC | PRN
  Administered 2021-10-07: 150 mg via INTRAVENOUS

## 2021-10-07 MED ORDER — METHOHEXITAL SODIUM 0.5 G IJ SOLR
INTRAMUSCULAR | Status: AC
  Filled 2021-10-07: qty 500

## 2021-10-07 MED ORDER — GLYCOPYRROLATE 0.2 MG/ML IJ SOLN: 0.1000 mg | Freq: Once | INTRAMUSCULAR | Status: AC

## 2021-10-07 MED ORDER — MIDAZOLAM HCL 2 MG/2ML IJ SOLN
INTRAMUSCULAR | Status: DC | PRN
Start: 2021-10-07 — End: 2021-10-07
  Administered 2021-10-07: 2 mg via INTRAVENOUS

## 2021-10-07 MED ORDER — LABETALOL HCL 5 MG/ML IV SOLN
INTRAVENOUS | Status: AC
  Filled 2021-10-07: qty 4

## 2021-10-07 MED ORDER — METHOHEXITAL SODIUM 100 MG/10ML IV SOSY
PREFILLED_SYRINGE | INTRAVENOUS | Status: DC | PRN
Start: 2021-10-07 — End: 2021-10-07
  Administered 2021-10-07: 100 mg via INTRAVENOUS

## 2021-10-07 MED ORDER — GLYCOPYRROLATE 0.2 MG/ML IJ SOLN
INTRAMUSCULAR | Status: AC
  Administered 2021-10-07: 0.1 mg via INTRAVENOUS
  Filled 2021-10-07: qty 1

## 2021-10-07 MED ORDER — SODIUM CHLORIDE 0.9 % IV SOLN: 500.0000 mL | Freq: Once | INTRAVENOUS | Status: AC

## 2021-10-07 MED ORDER — SUCCINYLCHOLINE CHLORIDE 200 MG/10ML IV SOSY
PREFILLED_SYRINGE | INTRAVENOUS | Status: AC
  Filled 2021-10-07: qty 10

## 2021-10-07 MED ORDER — MIDAZOLAM HCL 2 MG/2ML IJ SOLN
INTRAMUSCULAR | Status: AC
  Filled 2021-10-07: qty 2

## 2021-10-07 NOTE — Anesthesia Postprocedure Evaluation (Signed)
Anesthesia Post Note ? ?Patient: Carmen Kim ? ?Procedure(s) Performed: ECT TX ? ?Patient location during evaluation: PACU ?Anesthesia Type: General ?Level of consciousness: awake and alert ?Pain management: pain level controlled ?Vital Signs Assessment: post-procedure vital signs reviewed and stable ?Respiratory status: spontaneous breathing, nonlabored ventilation, respiratory function stable and patient connected to nasal cannula oxygen ?Cardiovascular status: blood pressure returned to baseline and stable ?Postop Assessment: no apparent nausea or vomiting ?Anesthetic complications: no ? ? ?No notable events documented. ? ? ?Last Vitals:  ?Vitals:  ? 10/07/21 1230 10/07/21 1243  ?BP: 139/87 139/87  ?Pulse: 85 84  ?Resp: 19 18  ?Temp: (!) 36.3 ?C (!) 36.3 ?C  ?SpO2: 98%   ?  ?Last Pain:  ?Vitals:  ? 10/07/21 1243  ?TempSrc: Temporal  ?PainSc: 0-No pain  ? ? ?  ?  ?  ?  ?  ?  ? ?Carmen Kim ? ? ? ? ?

## 2021-10-07 NOTE — Transfer of Care (Signed)
Immediate Anesthesia Transfer of Care Note ? ?Patient: Carmen Kim ? ?Procedure(s) Performed: ECT TX ? ?Patient Location: PACU ? ?Anesthesia Type:General ? ?Level of Consciousness: drowsy ? ?Airway & Oxygen Therapy: Patient Spontanous Breathing and Patient connected to nasal cannula oxygen ? ?Post-op Assessment: Report given to RN, Post -op Vital signs reviewed and stable and Patient moving all extremities ? ?Post vital signs: Reviewed and stable ? ?Last Vitals:  ?Vitals Value Taken Time  ?BP 131/85 10/07/21 1203  ?Temp    ?Pulse 80 10/07/21 1209  ?Resp 23 10/07/21 1209  ?SpO2 99 % 10/07/21 1209  ?Vitals shown include unvalidated device data. ? ?Last Pain:  ?Vitals:  ? 10/07/21 1040  ?TempSrc: Tympanic  ?PainSc: 0-No pain  ?   ? ?  ? ?Complications: No notable events documented. ?

## 2021-10-07 NOTE — H&P (Signed)
Carmen Kim is an 56 y.o. female.   ?Chief Complaint: Follow-up treatment #5 today for this patient with a history of depression.  Patient feels like she may have had a little bit of improvement but is not sure that she can judge it very well.  Sounds like a little bit of memory problem ?HPI: History of recurrent severe depression as part of a diagnosis of bipolar disorder ? ?Past Medical History:  ?Diagnosis Date  ? Anemia   ? Bipolar affect, depressed ([place])   ? Chronic kidney disease   ? mild being monitored  ? Depressed   ? Hemorrhoids   ? IBS (irritable bowel syndrome)   ? Seizures ([place]) 2008  ? from Wellbutrin only  ? ? ?Past Surgical History:  ?Procedure Laterality Date  ? ROOT CANAL    ? WISDOM TOOTH EXTRACTION    ? ? ?Family History  ?Problem Relation Age of Onset  ? Diabetes Mother   ? Hypertension Mother   ? Cerebral aneurysm Mother   ? Colon cancer Neg Hx   ? Esophageal cancer Neg Hx   ? Rectal cancer Neg Hx   ? Stomach cancer Neg Hx   ? Colon polyps Neg Hx   ? ?Social History:  reports that she has never smoked. She has never used smokeless tobacco. She reports current alcohol use of about 1.0 standard drink per week. She reports that she does not use drugs. ? ?Allergies:  ?Allergies  ?Allergen Reactions  ? Wellbutrin [Bupropion] Other (See Comments)  ?  seizures  ? ? ?(Not in a hospital admission) ? ? ?No results found for this or any previous visit (from the past 48 hour(s)). ?No results found. ? ?Review of Systems  ?Constitutional: Negative.   ?HENT: Negative.    ?Eyes: Negative.   ?Respiratory: Negative.    ?Cardiovascular: Negative.   ?Gastrointestinal: Negative.   ?Musculoskeletal: Negative.   ?Skin: Negative.   ?Neurological: Negative.   ?Psychiatric/Behavioral:  Positive for dysphoric mood. Negative for agitation, behavioral problems, confusion, decreased concentration and hallucinations. The patient is nervous/anxious. The patient is not hyperactive.   ?All other systems reviewed and are  negative. ? ?Blood pressure 135/82, pulse 82, temperature (!) 97.4 ?F (36.3 ?C), temperature source Tympanic, resp. rate 18, height '5\' 4"'$  (1.626 m), weight 109.5 kg, last menstrual period 04/19/2014, SpO2 98 %. ?Physical Exam ?Vitals and nursing note reviewed.  ?Constitutional:   ?   Appearance: She is well-developed.  ?HENT:  ?   Head: Normocephalic and atraumatic.  ?Eyes:  ?   Conjunctiva/sclera: Conjunctivae normal.  ?   Pupils: Pupils are equal, round, and reactive to light.  ?Cardiovascular:  ?   Heart sounds: Normal heart sounds.  ?Pulmonary:  ?   Effort: Pulmonary effort is normal.  ?Abdominal:  ?   Palpations: Abdomen is soft.  ?Musculoskeletal:     ?   General: Normal range of motion.  ?   Cervical back: Normal range of motion.  ?Skin: ?   General: Skin is warm and dry.  ?Neurological:  ?   Mental Status: She is alert.  ?Psychiatric:     ?   Attention and Perception: Attention normal.     ?   Mood and Affect: Mood is anxious.     ?   Speech: Speech normal.     ?   Behavior: Behavior is cooperative.     ?   Thought Content: Thought content normal.     ?   Cognition and Memory:  Cognition normal.  ?  ? ?Assessment/Plan ?Treatment 5 today and I recommend she come back on Monday at least for treatment 6 and we will reassess how well she is dealing ? ?Alethia Berthold, MD ?10/07/2021, 4:24 PM ? ? ? ?

## 2021-10-07 NOTE — Procedures (Signed)
ECT SERVICES ?Physician?s Interval Evaluation & Treatment Note ? ?Patient Identification: Carmen Kim ?MRN:  [id number] ?Date of Evaluation:  10/07/2021 ?[place] #: [id number] ? ?MADRS:  ? ?MMSE:  ? ?P.E. Findings: ? ?No change to physical exam ? ?Psychiatric Interval Note: ? ?Alert and oriented not confused affect actually does seem a little brighter ? ?Subjective: ? ?Patient is a 56 y.o. female seen for evaluation for Electroconvulsive Therapy. ?Admits to feeling a little less miserable ? ?Treatment Summary: ? ? '[x]'$   Right Unilateral             '[]'$  Bilateral ?  ?% Energy : 0.3 ms 55% ? ? Impedance: 1390 ohms ? ?Seizure Energy Index: 18,616 ?V squared ? ?Postictal Suppression Index: 86% ? ?Seizure Concordance Index: 98% ? ?Medications ? ?Pre Shock: Robinul 0.1 mg labetalol 10 mg Brevital 100 mg succinylcholine 150 mg ? ?Post Shock:   ? ?Seizure Duration: 27 seconds EMG 51 seconds EEG ? ? ?Comments: ?Recommend and she agrees to follow-up with next treatment Monday ? ?Lungs: ? ?'[x]'$   Clear to auscultation              ? ?'[]'$  Other:  ? ?Heart: ?  ? '[x]'$   Regular rhythm             '[]'$  irregular rhythm ? ? ? '[x]'$   Previous H&P reviewed, patient examined and there are NO CHANGES              ?  ? '[]'$   Previous H&P reviewed, patient examined and there are changes noted. ? ? ?Alethia Berthold, MD ?5/5/20234:26 PM ? ? ? ?

## 2021-10-07 NOTE — Anesthesia Preprocedure Evaluation (Signed)
Anesthesia Evaluation  ?Patient identified by MRN, date of birth, ID band ?Patient awake ? ? ? ?Reviewed: ?Allergy & Precautions, NPO status , Patient's Chart, lab work & pertinent test results ? ?History of Anesthesia Complications ?Negative for: history of anesthetic complications ? ?Airway ?Mallampati: II ? ?TM Distance: >3 FB ?Neck ROM: Full ? ? ? Dental ? ?(+) Poor Dentition, Chipped, Missing ?  ?Pulmonary ?neg pulmonary ROS, neg sleep apnea, neg COPD, Patient abstained from smoking.Not current smoker,  ?  ?Pulmonary exam normal ? ? ? ? ? ? ? Cardiovascular ?Exercise Tolerance: Good ?METS(-) hypertension(-) CAD and (-) Past MI negative cardio ROS ?Normal cardiovascular exam(-) dysrhythmias  ? ? ?  ?Neuro/Psych ?Seizures -,  PSYCHIATRIC DISORDERS Depression Bipolar Disorder   ? GI/Hepatic ?neg GERD  ,(+)  ?  ? (-) substance abuse ? ,   ?Endo/Other  ?neg diabetesMorbid obesity ? Renal/GU ?CRFRenal disease  ? ?  ?Musculoskeletal ? ? Abdominal ?(+) + obese,   ?Peds ? Hematology ? ?(+) Blood dyscrasia, anemia ,   ?Anesthesia Other Findings ?Past Medical History: ?No date: Anemia ?No date: Bipolar affect, depressed ([place]) ?No date: Chronic kidney disease ?    Comment:  mild being monitored ?No date: Depressed ?No date: Hemorrhoids ?No date: IBS (irritable bowel syndrome) ?2008: Seizures ([place]) ?    Comment:  from Wellbutrin only ? Reproductive/Obstetrics ? ?  ? ? ? ? ? ? ? ? ? ? ? ? ? ?  ?  ? ? ? ? ? ? ? ? ?Anesthesia Physical ? ?Anesthesia Plan ? ?ASA: 3 ? ?Anesthesia Plan: General  ? ?Post-op Pain Management:   ? ?Induction: Intravenous ? ?PONV Risk Score and Plan: 3 and Ondansetron and TIVA ? ?Airway Management Planned: Mask ? ?Additional Equipment: None ? ?Intra-op Plan:  ? ?Post-operative Plan:  ? ?Informed Consent: I have reviewed the patients History and Physical, chart, labs and discussed the procedure including the risks, benefits and alternatives for the proposed anesthesia  with the patient or authorized representative who has indicated his/her understanding and acceptance.  ? ? ? ?Dental advisory given ? ?Plan Discussed with: CRNA and Surgeon ? ?Anesthesia Plan Comments: ( ?)  ? ? ? ? ? ? ?Anesthesia Quick Evaluation ? ?

## 2021-10-08 ENCOUNTER — Other Ambulatory Visit: Payer: Self-pay | Admitting: Psychiatry

## 2021-10-10 ENCOUNTER — Ambulatory Visit: Payer: Self-pay | Admitting: Certified Registered Nurse Anesthetist

## 2021-10-10 ENCOUNTER — Encounter (HOSPITAL_BASED_OUTPATIENT_CLINIC_OR_DEPARTMENT_OTHER)
Admission: RE | Admit: 2021-10-10 | Discharge: 2021-10-10 | Disposition: A | Discharge status: Home / Self Care | Payer: Managed Care, Other (non HMO) | Source: Ambulatory Visit | Attending: Psychiatry | Admitting: Psychiatry

## 2021-10-10 ENCOUNTER — Encounter: Payer: Self-pay | Admitting: Certified Registered Nurse Anesthetist

## 2021-10-10 DIAGNOSIS — F332 Major depressive disorder, recurrent severe without psychotic features: Secondary | ICD-10-CM | POA: Diagnosis not present

## 2021-10-10 DIAGNOSIS — F314 Bipolar disorder, current episode depressed, severe, without psychotic features: Secondary | ICD-10-CM | POA: Diagnosis not present

## 2021-10-10 MED ORDER — LABETALOL HCL 5 MG/ML IV SOLN
INTRAVENOUS | Status: DC | PRN
  Administered 2021-10-10: 10 mg via INTRAVENOUS

## 2021-10-10 MED ORDER — GLYCOPYRROLATE 0.2 MG/ML IJ SOLN: 0.1000 mg | Freq: Once | INTRAMUSCULAR | Status: DC

## 2021-10-10 MED ORDER — SODIUM CHLORIDE 0.9 % IV SOLN: 500.0000 mL | Freq: Once | INTRAVENOUS | Status: AC

## 2021-10-10 MED ORDER — MIDAZOLAM HCL 2 MG/2ML IJ SOLN
INTRAMUSCULAR | Status: AC
  Filled 2021-10-10: qty 2

## 2021-10-10 MED ORDER — MIDAZOLAM HCL 2 MG/2ML IJ SOLN
INTRAMUSCULAR | Status: DC | PRN
  Administered 2021-10-10: 2 mg via INTRAVENOUS

## 2021-10-10 MED ORDER — METHOHEXITAL SODIUM 100 MG/10ML IV SOSY
PREFILLED_SYRINGE | INTRAVENOUS | Status: DC | PRN
Start: 2021-10-10 — End: 2021-10-10
  Administered 2021-10-10: 100 mg via INTRAVENOUS

## 2021-10-10 MED ORDER — SUCCINYLCHOLINE CHLORIDE 200 MG/10ML IV SOSY
PREFILLED_SYRINGE | INTRAVENOUS | Status: DC | PRN
  Administered 2021-10-10: 150 mg via INTRAVENOUS

## 2021-10-10 NOTE — Anesthesia Preprocedure Evaluation (Signed)
Anesthesia Evaluation  ?Patient identified by MRN, date of birth, ID band ?Patient awake ? ? ? ?Reviewed: ?Allergy & Precautions, NPO status , Patient's Chart, lab work & pertinent test results ? ?History of Anesthesia Complications ?Negative for: history of anesthetic complications ? ?Airway ?Mallampati: II ? ?TM Distance: >3 FB ?Neck ROM: Full ? ? ? Dental ? ?(+) Poor Dentition, Chipped, Missing ?  ?Pulmonary ?neg pulmonary ROS, neg sleep apnea, neg COPD, Patient abstained from smoking.Not current smoker,  ?  ?Pulmonary exam normal ? ? ? ? ? ? ? Cardiovascular ?Exercise Tolerance: Good ?METS(-) hypertension(-) CAD and (-) Past MI negative cardio ROS ?Normal cardiovascular exam(-) dysrhythmias  ? ? ?  ?Neuro/Psych ?Seizures -,  PSYCHIATRIC DISORDERS Depression Bipolar Disorder   ? GI/Hepatic ?neg GERD  ,(+)  ?  ? (-) substance abuse ? ,   ?Endo/Other  ?neg diabetesMorbid obesity ? Renal/GU ?CRFRenal disease  ? ?  ?Musculoskeletal ? ? Abdominal ?(+) + obese,   ?Peds ? Hematology ? ?(+) Blood dyscrasia, anemia ,   ?Anesthesia Other Findings ?Past Medical History: ?No date: Anemia ?No date: Bipolar affect, depressed ([place]) ?No date: Chronic kidney disease ?    Comment:  mild being monitored ?No date: Depressed ?No date: Hemorrhoids ?No date: IBS (irritable bowel syndrome) ?2008: Seizures ([place]) ?    Comment:  from Wellbutrin only ? Reproductive/Obstetrics ? ?  ? ? ? ? ? ? ? ? ? ? ? ? ? ?  ?  ? ? ? ? ? ? ? ? ?Anesthesia Physical ? ?Anesthesia Plan ? ?ASA: 3 ? ?Anesthesia Plan: General  ? ?Post-op Pain Management:   ? ?Induction: Intravenous ? ?PONV Risk Score and Plan: 3 and Ondansetron and TIVA ? ?Airway Management Planned: Mask ? ?Additional Equipment: None ? ?Intra-op Plan:  ? ?Post-operative Plan:  ? ?Informed Consent:  ? ?Plan Discussed with: CRNA and Surgeon ? ?Anesthesia Plan Comments: ( ?)  ? ? ? ? ? ? ?Anesthesia Quick Evaluation ? ?

## 2021-10-10 NOTE — Transfer of Care (Signed)
Immediate Anesthesia Transfer of Care Note ? ?Patient: Carmen Kim ? ?Procedure(s) Performed: ECT TX ? ?Patient Location: PACU ? ?Anesthesia Type:General ? ?Level of Consciousness: drowsy ? ?Airway & Oxygen Therapy: Patient Spontanous Breathing and Patient connected to face mask oxygen ? ?Post-op Assessment: Report given to RN and Post -op Vital signs reviewed and stable ? ?Post vital signs: Reviewed and stable ? ?Last Vitals:  ?Vitals Value Taken Time  ?BP 110/67 10/10/21 1403  ?Temp    ?Pulse 74 10/10/21 1404  ?Resp 22 10/10/21 1404  ?SpO2 99 % 10/10/21 1404  ?Vitals shown include unvalidated device data. ? ?Last Pain:  ?Vitals:  ? 10/10/21 1226  ?TempSrc:   ?PainSc: 0-No pain  ?   ? ?  ? ?Complications: No notable events documented. ?

## 2021-10-10 NOTE — H&P (Signed)
Carmen Kim is an 57 y.o. female.   ?Chief Complaint: Still depressed low energy and low motivation ?HPI: History of chronic depression so far showing little response to ECT ? ?Past Medical History:  ?Diagnosis Date  ? Anemia   ? Bipolar affect, depressed ([place])   ? Chronic kidney disease   ? mild being monitored  ? Depressed   ? Hemorrhoids   ? IBS (irritable bowel syndrome)   ? Seizures ([place]) 2008  ? from Wellbutrin only  ? ? ?Past Surgical History:  ?Procedure Laterality Date  ? ROOT CANAL    ? WISDOM TOOTH EXTRACTION    ? ? ?Family History  ?Problem Relation Age of Onset  ? Diabetes Mother   ? Hypertension Mother   ? Cerebral aneurysm Mother   ? Colon cancer Neg Hx   ? Esophageal cancer Neg Hx   ? Rectal cancer Neg Hx   ? Stomach cancer Neg Hx   ? Colon polyps Neg Hx   ? ?Social History:  reports that she has never smoked. She has never used smokeless tobacco. She reports current alcohol use of about 1.0 standard drink per week. She reports that she does not use drugs. ? ?Allergies:  ?Allergies  ?Allergen Reactions  ? Wellbutrin [Bupropion] Other (See Comments)  ?  seizures  ? ? ?(Not in a hospital admission) ? ? ?No results found for this or any previous visit (from the past 48 hour(s)). ?No results found. ? ?Review of Systems  ?Constitutional: Negative.   ?HENT: Negative.    ?Eyes: Negative.   ?Respiratory: Negative.    ?Cardiovascular: Negative.   ?Gastrointestinal: Negative.   ?Musculoskeletal: Negative.   ?Skin: Negative.   ?Neurological: Negative.   ?Psychiatric/Behavioral:  Positive for dysphoric mood.   ? ?Blood pressure 128/90, pulse 89, temperature 97.8 ?F (36.6 ?C), temperature source Tympanic, resp. rate 18, height '5\' 4"'$  (1.626 m), weight 109.5 kg, last menstrual period 04/19/2014, SpO2 99 %. ?Physical Exam ?Constitutional:   ?   Appearance: She is well-developed.  ?HENT:  ?   Head: Normocephalic and atraumatic.  ?Eyes:  ?   Conjunctiva/sclera: Conjunctivae normal.  ?   Pupils: Pupils are equal,  round, and reactive to light.  ?Cardiovascular:  ?   Heart sounds: Normal heart sounds.  ?Pulmonary:  ?   Effort: Pulmonary effort is normal.  ?Abdominal:  ?   Palpations: Abdomen is soft.  ?Musculoskeletal:     ?   General: Normal range of motion.  ?   Cervical back: Normal range of motion.  ?Skin: ?   General: Skin is warm and dry.  ?Neurological:  ?   Mental Status: She is alert.  ?Psychiatric:     ?   Mood and Affect: Mood is depressed.     ?   Speech: Speech normal.     ?   Behavior: Behavior is cooperative.  ?  ? ?Assessment/Plan ?We reviewed treatment options and I suggested trying switching to bilateral treatment.  Patient aware that this may increase cognitive and memory side effects.  She is agreeable to a trial.  We will start bilateral treatment today and then see how she is doing after a couple if there is any sign of improvement ? ?Alethia Berthold, MD ?10/10/2021, 4:47 PM ? ? ? ?

## 2021-10-10 NOTE — Procedures (Signed)
ECT SERVICES ?Physician?s Interval Evaluation & Treatment Note ? ?Patient Identification: Carmen Kim ?MRN:  [id number] ?Date of Evaluation:  10/10/2021 ?[place] #: 6 ? ?MADRS:  ? ?MMSE:  ? ?P.E. Findings: ? ?No change to physical exam ? ?Psychiatric Interval Note: ? ?Patient reports no real improvement still complains of lack of motivation and energy ? ?Subjective: ? ?Patient is a 56 y.o. female seen for evaluation for Electroconvulsive Therapy. ?Still depressed but no suicidal ideation ? ?Treatment Summary: ? ? '[]'$   Right Unilateral             '[x]'$  Bilateral ?  ?% Energy : 1.0 ms 55% ? ? Impedance: 1240 ohms ? ?Seizure Energy Index: 24,011 ?V squared ? ?Postictal Suppression Index: 74% ? ?Seizure Concordance Index: 98% ? ?Medications ? ?Pre Shock: Robinul 0.1 mg Brevital 100 mg succinylcholine 150 mg ? ?Post Shock:   ? ?Seizure Duration: 8 seconds EMG 54 seconds EEG ? ? ?Comments: ?We are switching to bilateral and will see if there is any improvement with a couple of treatments ? ?Lungs: ? ?'[x]'$   Clear to auscultation              ? ?'[]'$  Other:  ? ?Heart: ?  ? '[x]'$   Regular rhythm             '[]'$  irregular rhythm ? ? ? '[x]'$   Previous H&P reviewed, patient examined and there are NO CHANGES              ?  ? '[]'$   Previous H&P reviewed, patient examined and there are changes noted. ? ? ?Alethia Berthold, MD ?5/8/20234:50 PM ? ?  ?

## 2021-10-11 ENCOUNTER — Other Ambulatory Visit: Payer: Self-pay | Admitting: Psychiatry

## 2021-10-11 NOTE — Anesthesia Postprocedure Evaluation (Signed)
Anesthesia Post Note ? ?Patient: Dhrithi Riche ? ?Procedure(s) Performed: ECT TX ? ?Patient location during evaluation: PACU ?Anesthesia Type: General ?Level of consciousness: awake and alert ?Pain management: pain level controlled ?Vital Signs Assessment: post-procedure vital signs reviewed and stable ?Respiratory status: spontaneous breathing, nonlabored ventilation and respiratory function stable ?Cardiovascular status: blood pressure returned to baseline and stable ?Postop Assessment: no apparent nausea or vomiting ?Anesthetic complications: no ? ? ?No notable events documented. ? ? ?Last Vitals:  ?Vitals:  ? 10/10/21 1430 10/10/21 1547  ?BP: (!) 130/95 128/90  ?Pulse: 91 89  ?Resp: 20 18  ?Temp: 36.6 ?C 36.6 ?C  ?SpO2: 99%   ?  ?Last Pain:  ?Vitals:  ? 10/10/21 1547  ?TempSrc: Tympanic  ?PainSc: 0-No pain  ? ? ?  ?  ?  ?  ?  ?  ? ?[place] Ouch ? ? ? ? ?

## 2021-10-12 ENCOUNTER — Encounter (HOSPITAL_BASED_OUTPATIENT_CLINIC_OR_DEPARTMENT_OTHER)
Admission: RE | Admit: 2021-10-12 | Discharge: 2021-10-12 | Disposition: A | Discharge status: Home / Self Care | Payer: Managed Care, Other (non HMO) | Source: Ambulatory Visit | Attending: Psychiatry | Admitting: Psychiatry

## 2021-10-12 ENCOUNTER — Encounter: Payer: Self-pay | Admitting: Certified Registered"

## 2021-10-12 ENCOUNTER — Ambulatory Visit: Payer: Self-pay | Admitting: Certified Registered"

## 2021-10-12 VITALS — BP 132/85 | HR 94 | Temp 97.4°F | Resp 25

## 2021-10-12 DIAGNOSIS — F3181 Bipolar II disorder: Secondary | ICD-10-CM

## 2021-10-12 DIAGNOSIS — K921 Melena: Secondary | ICD-10-CM

## 2021-10-12 DIAGNOSIS — F332 Major depressive disorder, recurrent severe without psychotic features: Secondary | ICD-10-CM | POA: Diagnosis not present

## 2021-10-12 DIAGNOSIS — F314 Bipolar disorder, current episode depressed, severe, without psychotic features: Secondary | ICD-10-CM | POA: Diagnosis not present

## 2021-10-12 MED ORDER — GLYCOPYRROLATE 0.2 MG/ML IJ SOLN
0.2000 mg | Freq: Once | INTRAMUSCULAR | Status: DC
Start: 2021-10-12 — End: 2021-10-13

## 2021-10-12 MED ORDER — SODIUM CHLORIDE 0.9 % IV SOLN: 500.0000 mL | Freq: Once | INTRAVENOUS | Status: DC

## 2021-10-12 MED ORDER — MIDAZOLAM HCL 2 MG/2ML IJ SOLN
INTRAMUSCULAR | Status: DC | PRN
  Administered 2021-10-12: 2 mg via INTRAVENOUS

## 2021-10-12 MED ORDER — SUCCINYLCHOLINE CHLORIDE 200 MG/10ML IV SOSY
PREFILLED_SYRINGE | INTRAVENOUS | Status: DC | PRN
  Administered 2021-10-12: 150 mg via INTRAVENOUS

## 2021-10-12 MED ORDER — MIDAZOLAM HCL 2 MG/2ML IJ SOLN
INTRAMUSCULAR | Status: AC
  Filled 2021-10-12: qty 2

## 2021-10-12 MED ORDER — LABETALOL HCL 5 MG/ML IV SOLN
INTRAVENOUS | Status: DC | PRN
  Administered 2021-10-12: 10 mg via INTRAVENOUS

## 2021-10-12 MED ORDER — ONDANSETRON HCL 4 MG/2ML IJ SOLN: 4.0000 mg | Freq: Once | INTRAMUSCULAR | Status: DC | PRN

## 2021-10-12 MED ORDER — METHOHEXITAL SODIUM 100 MG/10ML IV SOSY
PREFILLED_SYRINGE | INTRAVENOUS | Status: DC | PRN
Start: 2021-10-12 — End: 2021-10-12
  Administered 2021-10-12: 100 mg via INTRAVENOUS

## 2021-10-12 MED ORDER — KETOROLAC TROMETHAMINE 30 MG/ML IJ SOLN: 30.0000 mg | Freq: Once | INTRAMUSCULAR | Status: DC

## 2021-10-12 NOTE — Transfer of Care (Signed)
Immediate Anesthesia Transfer of Care Note ? ?Patient: Carmen Kim ? ?Procedure(s) Performed: ECT TX ? ?Patient Location: PACU ? ?Anesthesia Type:General ? ?Level of Consciousness: drowsy ? ?Airway & Oxygen Therapy: Patient Spontanous Breathing and Patient connected to nasal cannula oxygen ? ?Post-op Assessment: Report given to RN ? ?Post vital signs: stable ? ?Last Vitals:  ?Vitals Value Taken Time  ?BP 119/76 10/12/21 1313  ?Temp 36.4 ?C 10/12/21 1313  ?Pulse 80 10/12/21 1313  ?Resp 24 10/12/21 1313  ?SpO2 96 % 10/12/21 1313  ?Vitals shown include unvalidated device data. ? ?Last Pain:  ?Vitals:  ? 10/12/21 1240  ?TempSrc:   ?PainSc: 0-No pain  ?   ? ?  ? ?Complications: No notable events documented. ?

## 2021-10-12 NOTE — H&P (Signed)
Carmen Kim is an 56 y.o. female.   ?Chief Complaint: No real change after the first bilateral treatment still complains of low energy dysphoric mood ?HPI: Chronic depression ? ?Past Medical History:  ?Diagnosis Date  ? Anemia   ? Bipolar affect, depressed ([place])   ? Chronic kidney disease   ? mild being monitored  ? Depressed   ? Hemorrhoids   ? IBS (irritable bowel syndrome)   ? Seizures ([place]) 2008  ? from Wellbutrin only  ? ? ?Past Surgical History:  ?Procedure Laterality Date  ? ROOT CANAL    ? WISDOM TOOTH EXTRACTION    ? ? ?Family History  ?Problem Relation Age of Onset  ? Diabetes Mother   ? Hypertension Mother   ? Cerebral aneurysm Mother   ? Colon cancer Neg Hx   ? Esophageal cancer Neg Hx   ? Rectal cancer Neg Hx   ? Stomach cancer Neg Hx   ? Colon polyps Neg Hx   ? ?Social History:  reports that she has never smoked. She has never used smokeless tobacco. She reports current alcohol use of about 1.0 standard drink per week. She reports that she does not use drugs. ? ?Allergies:  ?Allergies  ?Allergen Reactions  ? Wellbutrin [Bupropion] Other (See Comments)  ?  seizures  ? ? ?(Not in a hospital admission) ? ? ?No results found for this or any previous visit (from the past 48 hour(s)). ?No results found. ? ?Review of Systems  ?Constitutional: Negative.   ?HENT: Negative.    ?Eyes: Negative.   ?Respiratory: Negative.    ?Cardiovascular: Negative.   ?Gastrointestinal: Negative.   ?Musculoskeletal: Negative.   ?Skin: Negative.   ?Neurological: Negative.   ?Hematological: Negative.   ?Psychiatric/Behavioral:  Positive for dysphoric mood.   ? ?Blood pressure 132/85, pulse 94, temperature (!) 97.4 ?F (36.3 ?C), temperature source Tympanic, resp. rate (!) 25, last menstrual period 04/19/2014, SpO2 93 %. ?Physical Exam ?Nursing note reviewed.  ?Constitutional:   ?   Appearance: She is well-developed.  ?HENT:  ?   Head: Normocephalic and atraumatic.  ?Eyes:  ?   Conjunctiva/sclera: Conjunctivae normal.  ?   Pupils:  Pupils are equal, round, and reactive to light.  ?Cardiovascular:  ?   Heart sounds: Normal heart sounds.  ?Pulmonary:  ?   Effort: Pulmonary effort is normal.  ?Abdominal:  ?   Palpations: Abdomen is soft.  ?Musculoskeletal:     ?   General: Normal range of motion.  ?   Cervical back: Normal range of motion.  ?Skin: ?   General: Skin is warm and dry.  ?Neurological:  ?   General: No focal deficit present.  ?   Mental Status: She is alert.  ?Psychiatric:     ?   Attention and Perception: Attention normal.     ?   Mood and Affect: Mood is depressed.     ?   Speech: Speech normal.     ?   Behavior: Behavior normal.     ?   Cognition and Memory: Cognition normal.     ?   Judgment: Judgment normal.  ?  ? ?Assessment/Plan ?Still no improvement which I suggest we do bilateral today and see her back on Wednesday if there is still no sign of any improvement we may want to stop ? ?Alethia Berthold, MD ?10/12/2021, 5:54 PM ? ? ? ?

## 2021-10-12 NOTE — Anesthesia Postprocedure Evaluation (Signed)
Anesthesia Post Note ? ?Patient: Carmen Kim ? ?Procedure(s) Performed: ECT TX ? ?Patient location during evaluation: PACU ?Anesthesia Type: General ?Level of consciousness: awake and alert ?Pain management: pain level controlled ?Vital Signs Assessment: post-procedure vital signs reviewed and stable ?Respiratory status: spontaneous breathing, nonlabored ventilation, respiratory function stable and patient connected to nasal cannula oxygen ?Cardiovascular status: blood pressure returned to baseline and stable ?Postop Assessment: no apparent nausea or vomiting ?Anesthetic complications: no ? ? ?No notable events documented. ? ? ?Last Vitals:  ?Vitals:  ? 10/12/21 1313 10/12/21 1315  ?BP: 119/76 114/82  ?Pulse: 79 80  ?Resp: (!) 26 (!) 23  ?Temp: (!) 36.4 ?C   ?SpO2: 97% 95%  ?  ?Last Pain:  ?Vitals:  ? 10/12/21 1313  ?TempSrc:   ?PainSc: Asleep  ? ? ?  ?  ?  ?  ?  ?  ? ?[name] ? ? ? ? ?

## 2021-10-12 NOTE — Anesthesia Preprocedure Evaluation (Signed)
Anesthesia Evaluation  ?Patient identified by MRN, date of birth, ID band ?Patient awake ? ? ? ?Reviewed: ?Allergy & Precautions, NPO status , Patient's Chart, lab work & pertinent test results ? ?History of Anesthesia Complications ?Negative for: history of anesthetic complications ? ?Airway ?Mallampati: II ? ?TM Distance: >3 FB ?Neck ROM: Full ? ? ? Dental ? ?(+) Poor Dentition, Chipped, Missing ?  ?Pulmonary ?neg pulmonary ROS, neg sleep apnea, neg COPD, Patient abstained from smoking.Not current smoker,  ?  ?Pulmonary exam normal ? ? ? ? ? ? ? Cardiovascular ?Exercise Tolerance: Good ?METS(-) hypertension(-) CAD and (-) Past MI negative cardio ROS ?Normal cardiovascular exam(-) dysrhythmias  ? ? ?  ?Neuro/Psych ?Seizures -,  PSYCHIATRIC DISORDERS Depression Bipolar Disorder   ? GI/Hepatic ?neg GERD  ,(+)  ?  ? (-) substance abuse ? ,   ?Endo/Other  ?neg diabetesMorbid obesity ? Renal/GU ?CRFRenal disease  ? ?  ?Musculoskeletal ? ? Abdominal ?(+) + obese,   ?Peds ? Hematology ? ?(+) Blood dyscrasia, anemia ,   ?Anesthesia Other Findings ?Past Medical History: ?No date: Anemia ?No date: Bipolar affect, depressed ([place]) ?No date: Chronic kidney disease ?    Comment:  mild being monitored ?No date: Depressed ?No date: Hemorrhoids ?No date: IBS (irritable bowel syndrome) ?2008: Seizures ([place]) ?    Comment:  from Wellbutrin only ? Reproductive/Obstetrics ? ?  ? ? ? ? ? ? ? ? ? ? ? ? ? ?  ?  ? ? ? ? ? ? ? ? ?Anesthesia Physical ? ?Anesthesia Plan ? ?ASA: 3 ? ?Anesthesia Plan: General  ? ?Post-op Pain Management:   ? ?Induction: Intravenous ? ?PONV Risk Score and Plan: 3 and Ondansetron and TIVA ? ?Airway Management Planned: Mask ? ?Additional Equipment: None ? ?Intra-op Plan:  ? ?Post-operative Plan:  ? ?Informed Consent: I have reviewed the patients History and Physical, chart, labs and discussed the procedure including the risks, benefits and alternatives for the proposed anesthesia  with the patient or authorized representative who has indicated his/her understanding and acceptance.  ? ? ? ?Dental advisory given ? ?Plan Discussed with: CRNA and Surgeon ? ?Anesthesia Plan Comments: (Discussed risks of anesthesia with patient, including PONV, muscle aches. Rare risks discussed as well, such as cardiorespiratory sequelae, need for airway intervention and its associated risks including lip/dental/eye damage and sore throat, and allergic reactions. Discussed the role of CRNA in patient's perioperative care. Patient understands.)  ? ? ? ? ? ? ?Anesthesia Quick Evaluation ? ?

## 2021-10-12 NOTE — Procedures (Signed)
ECT SERVICES ?Physician?s Interval Evaluation & Treatment Note ? ?Patient Identification: Carmen Kim ?MRN:  [id number] ?Date of Evaluation:  10/12/2021 ?[place] #: 7 ? ?MADRS:  ? ?MMSE:  ? ?P.E. Findings: ? ?No change to physical exam ? ?Psychiatric Interval Note: ? ?No improvement still dysphoric ? ?Subjective: ? ?Patient is a 56 y.o. female seen for evaluation for Electroconvulsive Therapy. ?Not feeling much different but no new side effects ? ?Treatment Summary: ? ? '[]'$   Right Unilateral             '[x]'$  Bilateral ?  ?% Energy : 1.0 ms 55% ? ? Impedance: 1530 ohms ? ?Seizure Energy Index: 25,048 ?V squared ? ?Postictal Suppression Index: 91% ? ?Seizure Concordance Index: 97% ? ?Medications ? ?Pre Shock: Robinul 0.1 mg Brevital 100 mg succinylcholine 150 mg ? ?Post Shock:   ? ?Seizure Duration: 29 seconds EMG 40 seconds EEG ? ? ?Comments: ?We will do 1 more bilateral treatment on Friday and see if there is any improvement if not I think we will stop the course ? ?Lungs: ? ?'[x]'$   Clear to auscultation              ? ?'[]'$  Other:  ? ?Heart: ?  ? '[x]'$   Regular rhythm             '[]'$  irregular rhythm ? ? ? '[x]'$   Previous H&P reviewed, patient examined and there are NO CHANGES              ?  ? '[]'$   Previous H&P reviewed, patient examined and there are changes noted. ? ? ?Alethia Berthold, MD ?5/10/20235:56 PM ? ?  ?

## 2021-10-13 ENCOUNTER — Other Ambulatory Visit: Payer: Self-pay | Admitting: Psychiatry

## 2021-10-14 ENCOUNTER — Encounter (HOSPITAL_BASED_OUTPATIENT_CLINIC_OR_DEPARTMENT_OTHER)
Admission: RE | Admit: 2021-10-14 | Discharge: 2021-10-14 | Disposition: A | Discharge status: Home / Self Care | Payer: Managed Care, Other (non HMO) | Source: Ambulatory Visit | Attending: Psychiatry | Admitting: Psychiatry

## 2021-10-14 ENCOUNTER — Encounter: Payer: Self-pay | Admitting: Certified Registered"

## 2021-10-14 ENCOUNTER — Ambulatory Visit: Payer: Self-pay | Admitting: Certified Registered"

## 2021-10-14 DIAGNOSIS — F332 Major depressive disorder, recurrent severe without psychotic features: Secondary | ICD-10-CM

## 2021-10-14 DIAGNOSIS — F314 Bipolar disorder, current episode depressed, severe, without psychotic features: Secondary | ICD-10-CM | POA: Diagnosis not present

## 2021-10-14 MED ORDER — SODIUM CHLORIDE 0.9 % IV SOLN: 500.0000 mL | Freq: Once | INTRAVENOUS | Status: DC

## 2021-10-14 MED ORDER — KETOROLAC TROMETHAMINE 30 MG/ML IJ SOLN: 30.0000 mg | Freq: Once | INTRAMUSCULAR | Status: DC

## 2021-10-14 MED ORDER — METHOHEXITAL SODIUM 100 MG/10ML IV SOSY
PREFILLED_SYRINGE | INTRAVENOUS | Status: DC | PRN
  Administered 2021-10-14: 100 mg via INTRAVENOUS

## 2021-10-14 MED ORDER — GLYCOPYRROLATE 0.2 MG/ML IJ SOLN
0.2000 mg | Freq: Once | INTRAMUSCULAR | Status: DC
Start: 2021-10-14 — End: 2021-10-15

## 2021-10-14 MED ORDER — FENTANYL CITRATE (PF) 100 MCG/2ML IJ SOLN: 25.0000 ug | INTRAMUSCULAR | Status: DC | PRN

## 2021-10-14 MED ORDER — LACTATED RINGERS IV SOLN
INTRAVENOUS | Status: DC
Start: 2021-10-14 — End: 2021-10-15

## 2021-10-14 MED ORDER — ONDANSETRON HCL 4 MG/2ML IJ SOLN: 4.0000 mg | Freq: Once | INTRAMUSCULAR | Status: DC | PRN

## 2021-10-14 MED ORDER — SODIUM CHLORIDE 0.9 % IV SOLN: INTRAVENOUS | Status: DC | PRN

## 2021-10-14 MED ORDER — SUCCINYLCHOLINE CHLORIDE 200 MG/10ML IV SOSY
PREFILLED_SYRINGE | INTRAVENOUS | Status: DC | PRN
  Administered 2021-10-14: 150 mg via INTRAVENOUS

## 2021-10-14 MED ORDER — MIDAZOLAM HCL 2 MG/2ML IJ SOLN
INTRAMUSCULAR | Status: AC
Start: 2021-10-14 — End: ?
  Filled 2021-10-14: qty 2

## 2021-10-14 MED ORDER — MIDAZOLAM HCL 2 MG/2ML IJ SOLN
INTRAMUSCULAR | Status: DC | PRN
  Administered 2021-10-14: 2 mg via INTRAVENOUS

## 2021-10-14 MED ORDER — LABETALOL HCL 5 MG/ML IV SOLN
INTRAVENOUS | Status: DC | PRN
  Administered 2021-10-14: 10 mg via INTRAVENOUS

## 2021-10-14 NOTE — Anesthesia Postprocedure Evaluation (Signed)
Anesthesia Post Note ? ?Patient: Carmen Kim ? ?Procedure(s) Performed: ECT TX ? ?Patient location during evaluation: PACU ?Anesthesia Type: General ?Level of consciousness: awake and oriented ?Pain management: pain level controlled ?Vital Signs Assessment: post-procedure vital signs reviewed and stable ?Respiratory status: spontaneous breathing and nonlabored ventilation ?Cardiovascular status: stable ?Anesthetic complications: no ? ? ?No notable events documented. ? ? ?Last Vitals:  ?Vitals:  ? 10/14/21 1340 10/14/21 1350  ?BP: 105/82 (!) 131/100  ?Pulse: 84 74  ?Resp: (!) 27 17  ?Temp:    ?SpO2: 96% 96%  ?  ?Last Pain:  ?Vitals:  ? 10/14/21 1330  ?TempSrc:   ?PainSc: Asleep  ? ? ?  ?  ?  ?  ?  ?  ? ?VAN STAVEREN,Rashi Granier ? ? ? ? ?

## 2021-10-14 NOTE — Transfer of Care (Signed)
Immediate Anesthesia Transfer of Care Note ? ?Patient: Carmen Kim ? ?Procedure(s) Performed: ECT TX ? ?Patient Location: PACU ? ?Anesthesia Type:General ? ?Level of Consciousness: awake and drowsy ? ?Airway & Oxygen Therapy: Patient Spontanous Breathing ? ?Post-op Assessment: Report given to RN and Post -op Vital signs reviewed and stable ? ?Post vital signs: Reviewed and stable ? ?Last Vitals:  ?Vitals Value Taken Time  ?BP 105/82 10/14/21 1340  ?Temp 36.6 ?C 10/14/21 1330  ?Pulse 92 10/14/21 1342  ?Resp 33 10/14/21 1342  ?SpO2 96 % 10/14/21 1342  ?Vitals shown include unvalidated device data. ? ?Last Pain:  ?Vitals:  ? 10/14/21 1330  ?TempSrc:   ?PainSc: Asleep  ?   ? ?  ? ?Complications: No notable events documented. ?

## 2021-10-14 NOTE — H&P (Signed)
Carmen Kim is an 56 y.o. female.   ?Chief Complaint: Follow-up with this patient with bipolar disorder depressed.  Today she actually reports feeling a little better.  On the other hand she has been having short-term memory impairments.  She has had episodes of not remembering conversations that she has had.  She denies any suicidal thoughts and says her nervousness is actually better ?HPI: Chronic depression and anxiety ? ?Past Medical History:  ?Diagnosis Date  ? Anemia   ? Bipolar affect, depressed ([place])   ? Chronic kidney disease   ? mild being monitored  ? Depressed   ? Hemorrhoids   ? IBS (irritable bowel syndrome)   ? Seizures ([place]) 2008  ? from Wellbutrin only  ? ? ?Past Surgical History:  ?Procedure Laterality Date  ? ROOT CANAL    ? WISDOM TOOTH EXTRACTION    ? ? ?Family History  ?Problem Relation Age of Onset  ? Diabetes Mother   ? Hypertension Mother   ? Cerebral aneurysm Mother   ? Colon cancer Neg Hx   ? Esophageal cancer Neg Hx   ? Rectal cancer Neg Hx   ? Stomach cancer Neg Hx   ? Colon polyps Neg Hx   ? ?Social History:  reports that she has never smoked. She has never used smokeless tobacco. She reports current alcohol use of about 1.0 standard drink per week. She reports that she does not use drugs. ? ?Allergies:  ?Allergies  ?Allergen Reactions  ? Wellbutrin [Bupropion] Other (See Comments)  ?  seizures  ? ? ?(Not in a hospital admission) ? ? ?No results found for this or any previous visit (from the past 48 hour(s)). ?No results found. ? ?Review of Systems  ?Constitutional: Negative.   ?HENT: Negative.    ?Eyes: Negative.   ?Respiratory: Negative.    ?Cardiovascular: Negative.   ?Gastrointestinal: Negative.   ?Musculoskeletal: Negative.   ?Skin: Negative.   ?Neurological: Negative.   ?Psychiatric/Behavioral:  Positive for confusion, decreased concentration and dysphoric mood. Negative for hallucinations and suicidal ideas.   ?All other systems reviewed and are negative. ? ?Blood pressure  117/74, pulse 86, temperature 98 ?F (36.7 ?C), resp. rate (!) 21, height '5\' 4"'$  (1.626 m), weight 109.5 kg, last menstrual period 04/19/2014, SpO2 97 %. ?Physical Exam ?Vitals and nursing note reviewed.  ?Constitutional:   ?   Appearance: She is well-developed.  ?HENT:  ?   Head: Normocephalic and atraumatic.  ?Eyes:  ?   Conjunctiva/sclera: Conjunctivae normal.  ?   Pupils: Pupils are equal, round, and reactive to light.  ?Cardiovascular:  ?   Heart sounds: Normal heart sounds.  ?Pulmonary:  ?   Effort: Pulmonary effort is normal.  ?Abdominal:  ?   Palpations: Abdomen is soft.  ?Musculoskeletal:     ?   General: Normal range of motion.  ?   Cervical back: Normal range of motion.  ?Skin: ?   General: Skin is warm and dry.  ?Neurological:  ?   General: No focal deficit present.  ?   Mental Status: She is alert.  ?Psychiatric:     ?   Attention and Perception: Attention normal.     ?   Mood and Affect: Mood normal.     ?   Speech: Speech normal.     ?   Behavior: Behavior is cooperative.     ?   Thought Content: Thought content normal.     ?   Cognition and Memory: Memory is impaired.     ?  Judgment: Judgment normal.  ?  ? ?Assessment/Plan ?I gave her the option after explaining that further treatments would probably worsen the memory problems but might continue with the mood improvement.  Patient opts to have ECT today and return on Monday.  I advised her to minimize activity over the weekend that would be complicated like driving and she agrees to that. ? ?Alethia Berthold, MD ?10/14/2021, 3:44 PM ? ? ? ?

## 2021-10-14 NOTE — Procedures (Signed)
ECT SERVICES ?Physician?s Interval Evaluation & Treatment Note 11 ? ?Patient Identification: Carmen Kim ?MRN:  [id number] ?Date of Evaluation:  10/14/2021 ?[place] #: 8 ? ?MADRS:  ? ?MMSE:  ? ?P.E. Findings: ? ?No change to physical exam ? ?Psychiatric Interval Note: ? ?Mood better more upbeat smiling and laughing more but also having some memory impairment ? ?Subjective: ? ?Patient is a 56 y.o. female seen for evaluation for Electroconvulsive Therapy. ?She is aware of the memory problems but says her mood is definitely improved ? ?Treatment Summary: ? ? '[]'$   Right Unilateral             '[x]'$  Bilateral ?  ?% Energy : 1.0 ms 55% ? ? Impedance: 1670 ohms ? ?Seizure Energy Index: 20,726 ?V squared ? ?Postictal Suppression Index: 89% ? ?Seizure Concordance Index: 98% ? ?Medications ? ?Pre Shock: Robinul 0.1 mg Brevital 100 mg succinylcholine 150 mg ? ?Post Shock:   ? ?Seizure Duration: 38 seconds EMG 56 seconds EEG ? ? ?Comments: ?Follow-up Monday ? ?Lungs: ? ?'[x]'$   Clear to auscultation              ? ?'[]'$  Other:  ? ?Heart: ?  ? '[x]'$   Regular rhythm             '[]'$  irregular rhythm ? ? ? '[x]'$   Previous H&P reviewed, patient examined and there are NO CHANGES              ?  ? '[]'$   Previous H&P reviewed, patient examined and there are changes noted. ? ? ?Alethia Berthold, MD ?5/12/20233:46 PM ? ? ? ?

## 2021-10-14 NOTE — Anesthesia Preprocedure Evaluation (Signed)
Anesthesia Evaluation  ?Patient identified by MRN, date of birth, ID band ?Patient awake ? ? ? ?Airway ?Mallampati: II ? ?TM Distance: >3 FB ?Neck ROM: Full ? ? ? Dental ? ?(+) Chipped ?  ?Pulmonary ?neg pulmonary ROS,  ?  ? ?+ decreased breath sounds ? ? ? ? ? Cardiovascular ?Exercise Tolerance: Good ? ?Rhythm:Regular  ? ?  ?Neuro/Psych ?Seizures -,  Depression Bipolar Disorder   ? GI/Hepatic ?negative GI ROS, Neg liver ROS,   ?Endo/Other  ?Morbid obesity ? Renal/GU ?CRFRenal disease  ? ?  ?Musculoskeletal ? ? Abdominal ?(+) + obese,   ?Peds ?negative pediatric ROS ?(+)  Hematology ? ?(+) Blood dyscrasia, anemia ,   ?Anesthesia Other Findings ? ? Reproductive/Obstetrics ? ?  ? ? ? ? ? ? ? ? ? ? ? ? ? ?  ?  ? ? ? ? ? ? ? ? ?Anesthesia Physical ?Anesthesia Plan ? ?ASA: 3 ? ?Anesthesia Plan: General  ? ?Post-op Pain Management:   ? ?Induction: Intravenous ? ?PONV Risk Score and Plan:  ? ?Airway Management Planned: Natural Airway ? ?Additional Equipment:  ? ?Intra-op Plan:  ? ?Post-operative Plan:  ? ?Informed Consent: I have reviewed the patients History and Physical, chart, labs and discussed the procedure including the risks, benefits and alternatives for the proposed anesthesia with the patient or authorized representative who has indicated his/her understanding and acceptance.  ? ? ? ? ? ?Plan Discussed with: CRNA and Surgeon ? ?Anesthesia Plan Comments:   ? ? ? ? ? ? ?Anesthesia Quick Evaluation ? ?

## 2021-10-17 ENCOUNTER — Inpatient Hospital Stay
Admission: RE | Admit: 2021-10-17 | Discharge: 2021-10-17 | Disposition: A | Discharge status: Home / Self Care | Payer: Managed Care, Other (non HMO) | Source: Ambulatory Visit

## 2021-10-24 ENCOUNTER — Other Ambulatory Visit: Payer: Self-pay | Admitting: Nurse Practitioner

## 2021-10-24 ENCOUNTER — Other Ambulatory Visit: Payer: Self-pay

## 2021-10-24 DIAGNOSIS — E559 Vitamin D deficiency, unspecified: Secondary | ICD-10-CM

## 2021-10-24 MED ORDER — VITAMIN D (ERGOCALCIFEROL) 1.25 MG (50000 UNIT) PO CAPS: ORAL_CAPSULE | ORAL | 1 refills | Status: DC

## 2021-10-24 MED ORDER — L-METHYLFOLATE 15 MG PO TABS: 15.0000 mg | ORAL_TABLET | Freq: Every day | ORAL | 1 refills | Status: DC

## 2021-11-09 ENCOUNTER — Ambulatory Visit: Payer: Managed Care, Other (non HMO) | Admitting: Internal Medicine

## 2021-11-22 ENCOUNTER — Ambulatory Visit: Payer: Managed Care, Other (non HMO) | Admitting: Internal Medicine

## 2022-01-31 ENCOUNTER — Other Ambulatory Visit: Payer: Self-pay | Admitting: Nurse Practitioner

## 2022-01-31 DIAGNOSIS — Z1231 Encounter for screening mammogram for malignant neoplasm of breast: Secondary | ICD-10-CM

## 2022-02-02 ENCOUNTER — Encounter: Payer: Self-pay | Admitting: Internal Medicine

## 2022-02-14 ENCOUNTER — Encounter: Payer: Self-pay | Admitting: Internal Medicine

## 2022-02-28 ENCOUNTER — Ambulatory Visit (AMBULATORY_SURGERY_CENTER): Payer: Self-pay | Admitting: *Deleted

## 2022-02-28 ENCOUNTER — Other Ambulatory Visit: Payer: Self-pay

## 2022-02-28 VITALS — Ht 64.0 in | Wt 220.0 lb

## 2022-02-28 DIAGNOSIS — Z86010 Personal history of colonic polyps: Secondary | ICD-10-CM

## 2022-02-28 MED ORDER — NA SULFATE-K SULFATE-MG SULF 17.5-3.13-1.6 GM/177ML PO SOLN: 1.0000 | Freq: Once | ORAL | 0 refills | Status: AC

## 2022-02-28 NOTE — Progress Notes (Signed)
Pre visit completed in person.  No egg or soy allergy known to patient  No issues known to pt with past sedation with any surgeries or procedures Patient denies ever being told they had issues or difficulty with intubation  No FH of Malignant Hyperthermia Pt is not on diet pills Pt is not on  home 02  Pt is not on blood thinners  Pt denies issues with constipation  No A fib or A flutter  Pt instructed to use Singlecare.com or GoodRx for a price reduction on prep   

## 2022-03-07 ENCOUNTER — Encounter: Payer: Self-pay | Admitting: Internal Medicine

## 2022-03-08 ENCOUNTER — Other Ambulatory Visit (HOSPITAL_COMMUNITY): Payer: Self-pay | Admitting: Psychiatry

## 2022-03-08 DIAGNOSIS — F314 Bipolar disorder, current episode depressed, severe, without psychotic features: Secondary | ICD-10-CM

## 2022-03-20 ENCOUNTER — Encounter: Payer: Self-pay | Admitting: Internal Medicine

## 2022-03-20 ENCOUNTER — Ambulatory Visit (AMBULATORY_SURGERY_CENTER): Payer: Managed Care, Other (non HMO) | Admitting: Internal Medicine

## 2022-03-20 VITALS — BP 112/76 | HR 78 | Temp 96.4°F | Resp 11 | Ht 64.0 in | Wt 220.0 lb

## 2022-03-20 DIAGNOSIS — Z09 Encounter for follow-up examination after completed treatment for conditions other than malignant neoplasm: Secondary | ICD-10-CM | POA: Diagnosis present

## 2022-03-20 DIAGNOSIS — Z86010 Personal history of colonic polyps: Secondary | ICD-10-CM | POA: Diagnosis not present

## 2022-03-20 DIAGNOSIS — D122 Benign neoplasm of ascending colon: Secondary | ICD-10-CM

## 2022-03-20 MED ORDER — SODIUM CHLORIDE 0.9 % IV SOLN: 500.0000 mL | Freq: Once | INTRAVENOUS | Status: DC

## 2022-03-20 NOTE — Patient Instructions (Signed)
Information on polyps and diverticulosis given to you today.  Await pathology results.  Resume previous diet and medications.   YOU HAD AN ENDOSCOPIC PROCEDURE TODAY AT THE [facility]:   Refer to the procedure report that was given to you for any specific questions about what was found during the examination.  If the procedure report does not answer your questions, please call your gastroenterologist to clarify.  If you requested that your care partner not be given the details of your procedure findings, then the procedure report has been included in a sealed envelope for you to review at your convenience later.  YOU SHOULD EXPECT: Some feelings of bloating in the abdomen. Passage of more gas than usual.  Walking can help get rid of the air that was put into your GI tract during the procedure and reduce the bloating. If you had a lower endoscopy (such as a colonoscopy or flexible sigmoidoscopy) you may notice spotting of blood in your stool or on the toilet paper. If you underwent a bowel prep for your procedure, you may not have a normal bowel movement for a few days.  Please Note:  You might notice some irritation and congestion in your nose or some drainage.  This is from the oxygen used during your procedure.  There is no need for concern and it should clear up in a day or so.  SYMPTOMS TO REPORT IMMEDIATELY:  Following lower endoscopy (colonoscopy or flexible sigmoidoscopy):  Excessive amounts of blood in the stool  Significant tenderness or worsening of abdominal pains  Swelling of the abdomen that is new, acute  Fever of 100F or higher  For urgent or emergent issues, a gastroenterologist can be reached at any hour by calling [phone]. Do not use MyChart messaging for urgent concerns.    DIET:  We do recommend a small meal at first, but then you may proceed to your regular diet.  Drink plenty of fluids but you should avoid alcoholic beverages for 24  hours.  ACTIVITY:  You should plan to take it easy for the rest of today and you should NOT DRIVE or use heavy machinery until tomorrow (because of the sedation medicines used during the test).    FOLLOW UP: Our staff will call the number listed on your records the next business day following your procedure.  We will call around 7:15- 8:00 am to check on you and address any questions or concerns that you may have regarding the information given to you following your procedure. If we do not reach you, we will leave a message.     If any biopsies were taken you will be contacted by phone or by letter within the next 1-3 weeks.  Please call us at [phone] if you have not heard about the biopsies in 3 weeks.    SIGNATURES/CONFIDENTIALITY: You and/or your care partner have signed paperwork which will be entered into your electronic medical record.  These signatures attest to the fact that that the information above on your After Visit Summary has been reviewed and is understood.  Full responsibility of the confidentiality of this discharge information lies with you and/or your care-partner. 

## 2022-03-20 NOTE — Op Note (Signed)
[place] Patient Name: Carmen Kim Procedure Date: 03/20/2022 1:28 PM MRN: [id number] Endoscopist: Jerene Bears , MD Age: 56 Referring MD:  Date of Birth: May 12, 1966 Gender: Female Account #: [id number] Procedure:                Colonoscopy Indications:              High risk colon cancer surveillance: Personal                            history of multiple adenomas, Last colonoscopy:                            August 2020 (3 adenomas) Medicines:                Monitored Anesthesia Care Procedure:                Pre-Anesthesia Assessment:                           - Prior to the procedure, a History and Physical                            was performed, and patient medications and                            allergies were reviewed. The patient's tolerance of                            previous anesthesia was also reviewed. The risks                            and benefits of the procedure and the sedation                            options and risks were discussed with the patient.                            All questions were answered, and informed consent                            was obtained. Prior Anticoagulants: The patient has                            taken no previous anticoagulant or antiplatelet                            agents. ASA Grade Assessment: II - A patient with                            mild systemic disease. After reviewing the risks                            and benefits, the patient was deemed in  satisfactory condition to undergo the procedure.                           After obtaining informed consent, the colonoscope                            was passed under direct vision. Throughout the                            procedure, the patient's blood pressure, pulse, and                            oxygen saturations were monitored continuously. The                            PCF-HQ190L Colonoscope was introduced  through the                            anus and advanced to the cecum, identified by                            appendiceal orifice and ileocecal valve. The                            colonoscopy was performed without difficulty. The                            patient tolerated the procedure well. The quality                            of the bowel preparation was good. The ileocecal                            valve, appendiceal orifice, and rectum were                            photographed. Scope In: 1:36:51 PM Scope Out: 1:51:30 PM Scope Withdrawal Time: 0 hours 12 minutes 23 seconds  Total Procedure Duration: 0 hours 14 minutes 39 seconds  Findings:                 The digital rectal exam was normal.                           A 4 mm polyp was found in the ascending colon. The                            polyp was sessile. The polyp was removed with a                            cold snare. Resection and retrieval were complete.                           Multiple small and large-mouthed diverticula were  found in the sigmoid colon, descending colon,                            transverse colon and ascending colon.                           The retroflexed view of the distal rectum and anal                            verge was normal and showed no anal or rectal                            abnormalities. Complications:            No immediate complications. Estimated Blood Loss:     Estimated blood loss was minimal. Impression:               - One 4 mm polyp in the ascending colon, removed                            with a cold snare. Resected and retrieved.                           - Moderate diverticulosis in the sigmoid colon, in                            the descending colon, in the transverse colon and                            in the ascending colon.                           - The distal rectum and anal verge are normal on                             retroflexion view. Recommendation:           - Patient has a contact number available for                            emergencies. The signs and symptoms of potential                            delayed complications were discussed with the                            patient. Return to normal activities tomorrow.                            Written discharge instructions were provided to the                            patient.                           - Resume previous  diet.                           - Continue present medications.                           - Await pathology results.                           - Repeat colonoscopy is recommended for                            surveillance. The colonoscopy date will be                            determined after pathology results from today's                            exam become available for review. Jerene Bears, MD 03/20/2022 1:59:43 PM This report has been signed electronically.

## 2022-03-20 NOTE — Progress Notes (Signed)
VS by Ethan  Pt's states no medical or surgical changes since previsit or office visit.  

## 2022-03-20 NOTE — Progress Notes (Signed)
Pt resting comfortably. VSS. Airway intact. SBAR complete to RN. All questions answered.   

## 2022-03-20 NOTE — Progress Notes (Signed)
GASTROENTEROLOGY PROCEDURE H&P NOTE   Primary Care Physician: Minette Brine, FNP    Reason for Procedure:  History of colon polyps  Plan:    Colonoscopy  Patient is appropriate for endoscopic procedure(s) in the ambulatory ([place]) setting.  The nature of the procedure, as well as the risks, benefits, and alternatives were carefully and thoroughly reviewed with the patient. Ample time for discussion and questions allowed. The patient understood, was satisfied, and agreed to proceed.     HPI: Carmen Kim is a 56 y.o. female who presents for surveillance colonoscopy.  Medical history as below.  Tolerated the prep.  No recent chest pain or shortness of breath.  No abdominal pain today.  Past Medical History:  Diagnosis Date   Anemia    Bipolar affect, depressed ([place])    Chronic kidney disease    mild being monitored   Depressed    Hemorrhoids    IBS (irritable bowel syndrome)    Seizures ([place]) 2008   from Wellbutrin only    Past Surgical History:  Procedure Laterality Date   COLONOSCOPY     ROOT CANAL     WISDOM TOOTH EXTRACTION      Prior to Admission medications   Medication Sig Start Date End Date Taking? Authorizing Provider  ARIPiprazole (ABILIFY) 2 MG tablet Take by mouth. 03/16/22  Yes [provider]  aspirin-acetaminophen-caffeine (EXCEDRIN MIGRAINE) 494-496-75 MG tablet Take by mouth every 6 (six) hours as needed for headache.   Yes [provider]  busPIRone (BUSPAR) 10 MG tablet Take 10 mg by mouth 2 (two) times daily. 03/31/21  Yes [provider]  lamoTRIgine (LAMICTAL) 25 MG tablet Take 50 mg by mouth daily. 02/20/22  Yes [provider]  Vitamin D, Ergocalciferol, (DRISDOL) 1.25 MG (50000 UNIT) CAPS capsule TAKE 1 CAPSULE BY MOUTH 2 TIMES A WEEK 10/24/21  Yes Minette Brine, FNP  lamoTRIgine (LAMICTAL) 150 MG tablet Take by mouth. Patient not taking: Reported on 03/20/2022 03/16/22   [provider]     Current Outpatient Medications  Medication Sig Dispense Refill   ARIPiprazole (ABILIFY) 2 MG tablet Take by mouth.     aspirin-acetaminophen-caffeine (EXCEDRIN MIGRAINE) 250-250-65 MG tablet Take by mouth every 6 (six) hours as needed for headache.     busPIRone (BUSPAR) 10 MG tablet Take 10 mg by mouth 2 (two) times daily.     lamoTRIgine (LAMICTAL) 25 MG tablet Take 50 mg by mouth daily.     Vitamin D, Ergocalciferol, (DRISDOL) 1.25 MG (50000 UNIT) CAPS capsule TAKE 1 CAPSULE BY MOUTH 2 TIMES A WEEK 24 capsule 1   lamoTRIgine (LAMICTAL) 150 MG tablet Take by mouth. (Patient not taking: Reported on 03/20/2022)     Current Facility-Administered Medications  Medication Dose Route Frequency Provider Last Rate Last Admin   0.9 %  sodium chloride infusion  500 mL Intravenous Once Javaria Knapke, Lajuan Lines, MD        Allergies as of 03/20/2022 - Review Complete 03/20/2022  Allergen Reaction Noted   Wellbutrin [bupropion] Other (See Comments) 11/06/2013    Family History  Problem Relation Age of Onset   Diabetes Mother    Hypertension Mother    Cerebral aneurysm Mother    Colon cancer Maternal Grandmother    Esophageal cancer Neg Hx    Rectal cancer Neg Hx    Stomach cancer Neg Hx    Colon polyps Neg Hx     Social History   Socioeconomic History   Marital status: Single  Spouse name: Not on file   Number of children: Not on file   Years of education: Not on file   Highest education level: Not on file  Occupational History   Not on file  Tobacco Use   Smoking status: Never   Smokeless tobacco: Never  Vaping Use   Vaping Use: Never used  Substance and Sexual Activity   Alcohol use: Yes    Alcohol/week: 1.0 standard drink of alcohol    Types: 1 Glasses of wine per week    Comment: Rare   Drug use: No   Sexual activity: Not on file  Other Topics Concern   Not on file  Social History Narrative   Right handed   No caffeine use:    Social Determinants of Health   Financial  Resource Strain: Not on file  Food Insecurity: Not on file  Transportation Needs: Not on file  Physical Activity: Not on file  Stress: Not on file  Social Connections: Not on file  Intimate Partner Violence: Not on file    Physical Exam: Vital signs in last 24 hours: '@BP'$  117/82   Pulse 100   Temp (!) 96.4 F (35.8 C)   Ht '5\' 4"'$  (1.626 m)   Wt 220 lb (99.8 kg)   LMP 04/19/2014 (Exact Date)   SpO2 100%   BMI 37.76 kg/m  GEN: NAD EYE: Sclerae anicteric ENT: MMM CV: Non-tachycardic Pulm: CTA b/l GI: Soft, NT/ND NEURO:  Alert & Oriented x 3   Zenovia Jarred, MD [facility]  03/20/2022 1:34 PM

## 2022-03-20 NOTE — Progress Notes (Signed)
Called to room to assist during endoscopic procedure.  Patient ID and intended procedure confirmed with present staff. Received instructions for my participation in the procedure from the performing physician.  

## 2022-03-21 ENCOUNTER — Telehealth: Payer: Self-pay

## 2022-03-21 NOTE — Telephone Encounter (Signed)
  Follow up Call-     03/20/2022   12:48 PM  Call back number  Post procedure Call Back phone  # [phone]  Permission to leave phone message Yes     Patient questions:  Do you have a fever, pain , or abdominal swelling? No. Pain Score  0 *  Have you tolerated food without any problems? Yes.    Have you been able to return to your normal activities? Yes.    Do you have any questions about your discharge instructions: Diet   No. Medications  No. Follow up visit  No.  Do you have questions or concerns about your Care? No.  Actions: * If pain score is 4 or above: No action needed, pain <4.

## 2022-03-23 ENCOUNTER — Encounter: Payer: Self-pay | Admitting: Internal Medicine

## 2022-04-20 ENCOUNTER — Ambulatory Visit: Payer: Managed Care, Other (non HMO)

## 2022-04-20 ENCOUNTER — Encounter: Payer: Self-pay | Admitting: Nurse Practitioner

## 2022-04-20 ENCOUNTER — Other Ambulatory Visit (HOSPITAL_COMMUNITY)
Admission: RE | Admit: 2022-04-20 | Discharge: 2022-04-20 | Disposition: A | Discharge status: Home / Self Care | Payer: Managed Care, Other (non HMO) | Source: Ambulatory Visit | Attending: Nurse Practitioner | Admitting: Nurse Practitioner

## 2022-04-20 ENCOUNTER — Ambulatory Visit: Payer: Managed Care, Other (non HMO) | Admitting: Nurse Practitioner

## 2022-04-20 ENCOUNTER — Ambulatory Visit
Admission: RE | Admit: 2022-04-20 | Discharge: 2022-04-20 | Disposition: A | Discharge status: Home / Self Care | Payer: Managed Care, Other (non HMO) | Source: Ambulatory Visit | Attending: Nurse Practitioner | Admitting: Nurse Practitioner

## 2022-04-20 ENCOUNTER — Other Ambulatory Visit: Payer: Self-pay | Admitting: Nurse Practitioner

## 2022-04-20 VITALS — BP 138/70 | HR 93 | Temp 98.5°F | Ht 64.0 in | Wt 230.0 lb

## 2022-04-20 DIAGNOSIS — N644 Mastodynia: Secondary | ICD-10-CM

## 2022-04-20 DIAGNOSIS — Z780 Asymptomatic menopausal state: Secondary | ICD-10-CM

## 2022-04-20 DIAGNOSIS — Z124 Encounter for screening for malignant neoplasm of cervix: Secondary | ICD-10-CM | POA: Insufficient documentation

## 2022-04-20 DIAGNOSIS — E6609 Other obesity due to excess calories: Secondary | ICD-10-CM

## 2022-04-20 DIAGNOSIS — Z1231 Encounter for screening mammogram for malignant neoplasm of breast: Secondary | ICD-10-CM

## 2022-04-20 DIAGNOSIS — F3181 Bipolar II disorder: Secondary | ICD-10-CM | POA: Diagnosis not present

## 2022-04-20 DIAGNOSIS — R61 Generalized hyperhidrosis: Secondary | ICD-10-CM

## 2022-04-20 DIAGNOSIS — E559 Vitamin D deficiency, unspecified: Secondary | ICD-10-CM

## 2022-04-20 DIAGNOSIS — Z23 Encounter for immunization: Secondary | ICD-10-CM

## 2022-04-20 DIAGNOSIS — E782 Mixed hyperlipidemia: Secondary | ICD-10-CM

## 2022-04-20 DIAGNOSIS — Z0000 Encounter for general adult medical examination without abnormal findings: Secondary | ICD-10-CM

## 2022-04-20 DIAGNOSIS — Z6839 Body mass index (BMI) 39.0-39.9, adult: Secondary | ICD-10-CM

## 2022-04-20 MED ORDER — VEOZAH 45 MG PO TABS: 1.0000 | ORAL_TABLET | Freq: Every day | ORAL | 2 refills | Status: DC

## 2022-04-20 NOTE — Progress Notes (Signed)
I,Tianna Badgett,acting as a Education administrator for [employer], FNP.,have documented all relevant documentation on the behalf of Minette Brine, FNP,as directed by  Minette Brine, FNP while in the presence of Minette Brine, [place].  Subjective:     Patient ID: Carmen Kim , female    DOB: 1966/02/28 , 56 y.o.   MRN: [id number]   Chief Complaint  Patient presents with   Annual Exam    HPI  Pt here for HM. She has her Mammogram today. She is seeing mental health once a week and every few months for medication management. She admits to having a rough year[facility] She is no longer on Pristiq and her menopause symptoms.      Past Medical History:  Diagnosis Date   Anemia    Bipolar affect, depressed ([place])    Chronic kidney disease    mild being monitored   Depressed    Hemorrhoids    IBS (irritable bowel syndrome)    Seizures ([place]) 2008   from Wellbutrin only     Family History  Problem Relation Age of Onset   Diabetes Mother    Hypertension Mother    Cerebral aneurysm Mother    Colon cancer Maternal Grandmother    Esophageal cancer Neg Hx    Rectal cancer Neg Hx    Stomach cancer Neg Hx    Colon polyps Neg Hx      Current Outpatient Medications:    ARIPiprazole (ABILIFY) 5 MG tablet, Take 5 mg by mouth daily., Disp: , Rfl:    aspirin-acetaminophen-caffeine (EXCEDRIN MIGRAINE) 250-250-65 MG tablet, Take by mouth every 6 (six) hours as needed for headache., Disp: , Rfl:    busPIRone (BUSPAR) 10 MG tablet, Take 10 mg by mouth 2 (two) times daily., Disp: , Rfl:    Fezolinetant (VEOZAH) 45 MG TABS, Take 1 tablet by mouth daily., Disp: 30 tablet, Rfl: 2   lamoTRIgine (LAMICTAL) 150 MG tablet, Take by mouth., Disp: , Rfl:    Vitamin D, Ergocalciferol, (DRISDOL) 1.25 MG (50000 UNIT) CAPS capsule, TAKE 1 CAPSULE BY MOUTH 2 TIMES A WEEK, Disp: 24 capsule, Rfl: 1   Allergies  Allergen Reactions   Wellbutrin [Bupropion] Other (See Comments)    seizures      The patient  states she is post menopausal status.  Patient's last menstrual period was 04/19/2014 (exact date).. Negative for Dysmenorrhea and Negative for Menorrhagia. Negative for: breast discharge, breast lump(s), breast pain and breast self exam. Associated symptoms include abnormal vaginal bleeding. Pertinent negatives include abnormal bleeding (hematology), anxiety, decreased libido, depression, difficulty falling sleep, dyspareunia, history of infertility, nocturia, sexual dysfunction, sleep disturbances, urinary incontinence, urinary urgency, vaginal discharge and vaginal itching. Diet regular.The patient states her exercise level is    . The patient's tobacco use is:  Social History   Tobacco Use  Smoking Status Never  Smokeless Tobacco Never  . She has been exposed to passive smoke. The patient's alcohol use is:  Social History   Substance and Sexual Activity  Alcohol Use Yes   Alcohol/week: 1.0 standard drink of alcohol   Types: 1 Glasses of wine per week   Comment: Rare   Additional information: Last pap 12/17/2018, next one scheduled for today.    Review of Systems  Constitutional: Negative.   HENT: Negative.    Eyes: Negative.   Respiratory: Negative.    Cardiovascular: Negative.   Gastrointestinal: Negative.   Endocrine: Negative.   Genitourinary: Negative.   Musculoskeletal: Negative.   Skin:  Negative.   Allergic/Immunologic: Negative.   Neurological: Negative.   Hematological: Negative.   Psychiatric/Behavioral:  Positive for suicidal ideas (no plan). The patient is not nervous/anxious.      Today's Vitals   04/20/22 0844  BP: 138/70  Pulse: 93  Temp: 98.5 F (36.9 C)  TempSrc: Oral  Weight: 230 lb (104.3 kg)  Height: _0  (1.626 m)  PainSc: 0-No pain   Body mass index is 39.48 kg/m.  Wt Readings from Last 3 Encounters:  04/20/22 230 lb (104.3 kg)  03/20/22 220 lb (99.8 kg)  02/28/22 220 lb (99.8 kg)     Objective:  Physical Exam Vitals reviewed. Exam  conducted with a chaperone present.  Constitutional:      General: She is not in acute distress.    Appearance: Normal appearance. She is well-developed. She is obese.  HENT:     Head: Normocephalic and atraumatic.     Right Ear: Hearing, tympanic membrane, ear canal and external ear normal. There is no impacted cerumen.     Left Ear: Hearing, tympanic membrane, ear canal and external ear normal. There is no impacted cerumen.     Nose:     Comments: Deferred - masked    Mouth/Throat:     Comments: Deferred - masked Eyes:     General: Lids are normal.     Extraocular Movements: Extraocular movements intact.     Conjunctiva/sclera: Conjunctivae normal.     Pupils: Pupils are equal, round, and reactive to light.     Funduscopic exam:    Right eye: No papilledema.        Left eye: No papilledema.  Neck:     Thyroid: No thyroid mass.     Vascular: No carotid bruit.  Cardiovascular:     Rate and Rhythm: Normal rate and regular rhythm.     Pulses: Normal pulses.     Heart sounds: Normal heart sounds. No murmur heard. Pulmonary:     Effort: Pulmonary effort is normal. No respiratory distress.     Breath sounds: Normal breath sounds. No wheezing.  Chest:     Chest wall: No mass.  Breasts:    Tanner Score is 5.     Right: Normal. No mass or tenderness.     Left: Normal. No mass or tenderness.  Abdominal:     General: Abdomen is flat. Bowel sounds are normal. There is no distension.     Palpations: Abdomen is soft.     Tenderness: There is no abdominal tenderness.     Hernia: There is no hernia in the right inguinal area.  Genitourinary:    Exam position: Lithotomy position.     Tanner stage (genital): 5.     Labia:        Right: No rash.        Left: No rash.      Vagina: Normal.     Uterus: Normal.      Adnexa: Right adnexa normal and left adnexa normal.     Comments: Cervix pointed posteriorly Musculoskeletal:        General: No swelling or tenderness. Normal range of  motion.     Cervical back: Full passive range of motion without pain, normal range of motion and neck supple.     Right lower leg: No edema.     Left lower leg: No edema.  Lymphadenopathy:     Upper Body:     Right upper body: No supraclavicular, axillary or pectoral adenopathy.  Left upper body: No supraclavicular, axillary or pectoral adenopathy.  Skin:    General: Skin is warm and dry.     Capillary Refill: Capillary refill takes less than 2 seconds.  Neurological:     General: No focal deficit present.     Mental Status: She is alert and oriented to person, place, and time.     Cranial Nerves: No cranial nerve deficit.     Sensory: No sensory deficit.     Motor: No weakness.     Comments: Negative phalen and tinel  Psychiatric:        Mood and Affect: Mood normal.        Behavior: Behavior normal.        Thought Content: Thought content normal.        Judgment: Judgment normal.         Assessment And Plan:     1. Annual physical exam Behavior modifications discussed and diet history reviewed.   Pt will continue to exercise regularly and modify diet with low GI, plant based foods and decrease intake of processed foods.  Recommend intake of daily multivitamin, Vitamin D, and calcium.  Recommend mammogram and colonoscopy for preventive screenings, as well as recommend immunizations that include influenza, TDAP, and Shingles  2. Class 2 obesity due to excess calories without serious comorbidity with body mass index (BMI) of 39.0 to 39.9 in adult She is encouraged to strive for BMI less than 30 to decrease cardiac risk. Advised to aim for at least 150 minutes of exercise per week. - Hemoglobin A1c - Lipid panel  3. Encounter for Papanicolaou smear of cervix Comments: PAP done, cervix is tilted posteriorly - Cytology -Pap Smear  4. Need for influenza vaccination Influenza vaccine administered Encouraged to take Tylenol as needed for fever or muscle aches. - Flu Vaccine  QUAD 6+ mos PF IM (Fluarix Quad PF)  5. Vitamin D deficiency Will check vitamin D level and supplement as needed.    Also encouraged to spend 15 minutes in the sun daily.  - VITAMIN D 25 Hydroxy (Vit-D Deficiency, Fractures)  6. Night sweats Comments: She has been discontinued from her Pristiq, will try to start her on Veozah pending insurance approval. - Fezolinetant (VEOZAH) 45 MG TABS; Take 1 tablet by mouth daily.  Dispense: 30 tablet; Refill: 2  7. Menopause Comments: Will try her on Veozah, samples given in office - Fezolinetant (VEOZAH) 45 MG TABS; Take 1 tablet by mouth daily.  Dispense: 30 tablet; Refill: 2  8. Bipolar 2 disorder ([place]) Comments: Continue f/u with psychiatry - CMP14+EGFR  9. Breast tenderness Comments: Left breast tenderness, no abnormal findings. She is going for her screening Mammogram. Pt instructed on Self Breast Exam.According to ACOG guidelines Women aged 38 and older are recommended to get an annual mammogram. Form completed and given to patient contact the [facility] for appointment scheduing.  Pt encouraged to get annual mammogram - MM DIAG BREAST TOMO UNI LEFT; Future  10. Mixed hyperlipidemia Comments: Cholesterol levels have been slightly elevated, will check lipid panel. Encouraged to eat a low fat diet - Lipid panel She is encouraged to strive for BMI less than 30 to decrease cardiac risk. Advised to aim for at least 150 minutes of exercise per week.     Patient was given opportunity to ask questions. Patient verbalized understanding of the plan and was able to repeat key elements of the plan. All questions were answered to their satisfaction.   Doreene Burke  Laurance Flatten, FNP   I, Minette Brine, FNP, have reviewed all documentation for this visit. The documentation on 04/19/22 for the exam, diagnosis, procedures, and orders are all accurate and complete.   THE PATIENT IS ENCOURAGED TO PRACTICE SOCIAL DISTANCING DUE TO THE COVID-19 PANDEMIC.

## 2022-04-20 NOTE — Patient Instructions (Signed)

## 2022-04-21 LAB — CMP14+EGFR
ALT: 16 IU/L (ref 0–32)
AST: 21 IU/L (ref 0–40)
Albumin/Globulin Ratio: 1.5 (ref 1.2–2.2)
Albumin: 4.5 g/dL (ref 3.8–4.9)
Alkaline Phosphatase: 126 IU/L — ABNORMAL HIGH (ref 44–121)
BUN/Creatinine Ratio: 21 (ref 9–23)
BUN: 23 mg/dL (ref 6–24)
Bilirubin Total: 0.8 mg/dL (ref 0.0–1.2)
CO2: 23 mmol/L (ref 20–29)
Calcium: 10.5 mg/dL — ABNORMAL HIGH (ref 8.7–10.2)
Chloride: 103 mmol/L (ref 96–106)
Creatinine, Ser: 1.12 mg/dL — ABNORMAL HIGH (ref 0.57–1.00)
Globulin, Total: 3.1 g/dL (ref 1.5–4.5)
Glucose: 79 mg/dL (ref 70–99)
Potassium: 5.2 mmol/L (ref 3.5–5.2)
Sodium: 139 mmol/L (ref 134–144)
Total Protein: 7.6 g/dL (ref 6.0–8.5)
eGFR: 58 mL/min/{1.73_m2} — ABNORMAL LOW (ref >59)

## 2022-04-21 LAB — LIPID PANEL
Chol/HDL Ratio: 2.4 ratio (ref 0.0–4.4)
Cholesterol, Total: 198 mg/dL (ref 100–199)
HDL: 84 mg/dL (ref >39)
LDL Chol Calc (NIH): 101 mg/dL — ABNORMAL HIGH (ref 0–99)
Triglycerides: 70 mg/dL (ref 0–149)
VLDL Cholesterol Cal: 13 mg/dL (ref 5–40)

## 2022-04-21 LAB — HEMOGLOBIN A1C
Est. average glucose Bld gHb Est-mCnc: 108 mg/dL
Hgb A1c MFr Bld: 5.4 % (ref 4.8–5.6)

## 2022-04-21 LAB — VITAMIN D 25 HYDROXY (VIT D DEFICIENCY, FRACTURES): Vit D, 25-Hydroxy: 77.3 ng/mL (ref 30.0–100.0)

## 2022-04-25 ENCOUNTER — Other Ambulatory Visit: Payer: Self-pay | Admitting: Nurse Practitioner

## 2022-04-25 DIAGNOSIS — N644 Mastodynia: Secondary | ICD-10-CM

## 2022-04-25 LAB — CYTOLOGY - PAP
Comment: NEGATIVE
Diagnosis: NEGATIVE
High risk HPV: NEGATIVE

## 2022-05-13 ENCOUNTER — Encounter: Payer: Self-pay | Admitting: Nurse Practitioner

## 2022-05-20 ENCOUNTER — Encounter: Payer: Self-pay | Admitting: Nurse Practitioner

## 2022-05-31 ENCOUNTER — Other Ambulatory Visit: Payer: Self-pay | Admitting: Nurse Practitioner

## 2022-05-31 DIAGNOSIS — N289 Disorder of kidney and ureter, unspecified: Secondary | ICD-10-CM

## 2022-05-31 DIAGNOSIS — E66812 Obesity, class 2: Secondary | ICD-10-CM

## 2022-05-31 DIAGNOSIS — E6609 Other obesity due to excess calories: Secondary | ICD-10-CM

## 2022-05-31 DIAGNOSIS — E782 Mixed hyperlipidemia: Secondary | ICD-10-CM

## 2022-06-14 ENCOUNTER — Ambulatory Visit: Payer: Managed Care, Other (non HMO)

## 2022-06-14 ENCOUNTER — Ambulatory Visit
Admission: RE | Admit: 2022-06-14 | Discharge: 2022-06-14 | Disposition: A | Discharge status: Home / Self Care | Payer: Managed Care, Other (non HMO) | Source: Ambulatory Visit | Attending: Nurse Practitioner | Admitting: Nurse Practitioner

## 2022-06-14 DIAGNOSIS — N644 Mastodynia: Secondary | ICD-10-CM

## 2022-06-19 ENCOUNTER — Ambulatory Visit: Payer: Managed Care, Other (non HMO) | Admitting: Skilled Nursing Facility1

## 2022-07-20 ENCOUNTER — Other Ambulatory Visit: Payer: Self-pay | Admitting: Nurse Practitioner

## 2022-07-20 DIAGNOSIS — E559 Vitamin D deficiency, unspecified: Secondary | ICD-10-CM

## 2022-07-24 ENCOUNTER — Encounter: Payer: Self-pay | Admitting: Skilled Nursing Facility1

## 2022-07-24 ENCOUNTER — Encounter: Payer: Managed Care, Other (non HMO) | Attending: Nurse Practitioner | Admitting: Skilled Nursing Facility1

## 2022-07-24 DIAGNOSIS — E669 Obesity, unspecified: Secondary | ICD-10-CM | POA: Insufficient documentation

## 2022-07-24 NOTE — Progress Notes (Signed)
Medical Nutrition Therapy   Primary concerns today: overall healthy diet for her kidney Referral diagnosis: e66.09   NUTRITION ASSESSMENT    Clinical Medical Hx: bipolar, IBS, CKD, anemia Medications: see list Labs: creatinine 1.12, GFR 58, calcium 10.5, alkaline phosphatase  Notable Signs/Symptoms: sinus headaches   Lifestyle & Dietary Hx  Pt states she sees her therapist biweekly.  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   Pt states she started volunteering which she enjoys.   Estimated daily fluid intake: 61 oz Supplements: N/A Sleep: not great Stress / self-care: stress is up and down  Current average weekly physical activity:   24-Hr Dietary Recall: 2 snacks a day First Meal: scrambled egg and applesauce or half sweet potato + almond butter and berries Snack:  triscuits and cheese or grapes or apple + shortbread cookies  and low salt tortilla and salsa Second Meal: skipped  Snack:  triscuits and cheese or grapes or apple + lemon bars Third Meal: salad: kale, apples, chicken, almonds, goat cheese, wild rice or  air fried chicken + sweet potato + broclini  Snack: snacking on sweets  Beverages: water, cold brew tea apple cucumber, black tea    NUTRITION INTERVENTION  Nutrition education (E-1) on the following topics:  Emotional eating  Meal Skipping Appropriate portion sizes  Physical activity  Appropriate fluid consumption  Avoidance of animal protein and increasing plant proteins  Continued limitation of processed foods   Handouts Provided Include  Meal ideas sheet  Learning Style & Readiness for Change Teaching method utilized: Visual & Auditory  Demonstrated degree of understanding via: Teach Back  Barriers to learning/adherence to lifestyle change: emotional eating  Goals Established by Pt Have greek yogurt + berries + honey for lunch Go for a walk every morning  Try  [employer] Try yoga with Adrienne  Limit and control animal proteins  Portion sizes: lean protein 3 ounces, 1/2 cup CHO, non starchy 1 full cup   MONITORING & EVALUATION Dietary intake, weekly physical activity  Next Steps  Patient is to call or email with any future questions or concerns

## 2022-10-09 ENCOUNTER — Encounter (HOSPITAL_COMMUNITY): Payer: Self-pay

## 2023-02-25 IMAGING — MG MM DIGITAL SCREENING BILAT W/ TOMO AND CAD
8 of 14 series · 8 of 40 positions shown · non-contrast
Comparison: Previous exam(s).

CLINICAL DATA: Screening.

EXAM:
DIGITAL SCREENING BILATERAL MAMMOGRAM WITH TOMOSYNTHESIS AND CAD
TECHNIQUE: Bilateral screening digital craniocaudal and mediolateral oblique
mammograms were obtained. Bilateral screening digital breast
tomosynthesis was performed. The images were evaluated with
computer-aided detection.

[R MLO synth-2D (1 of 2)]
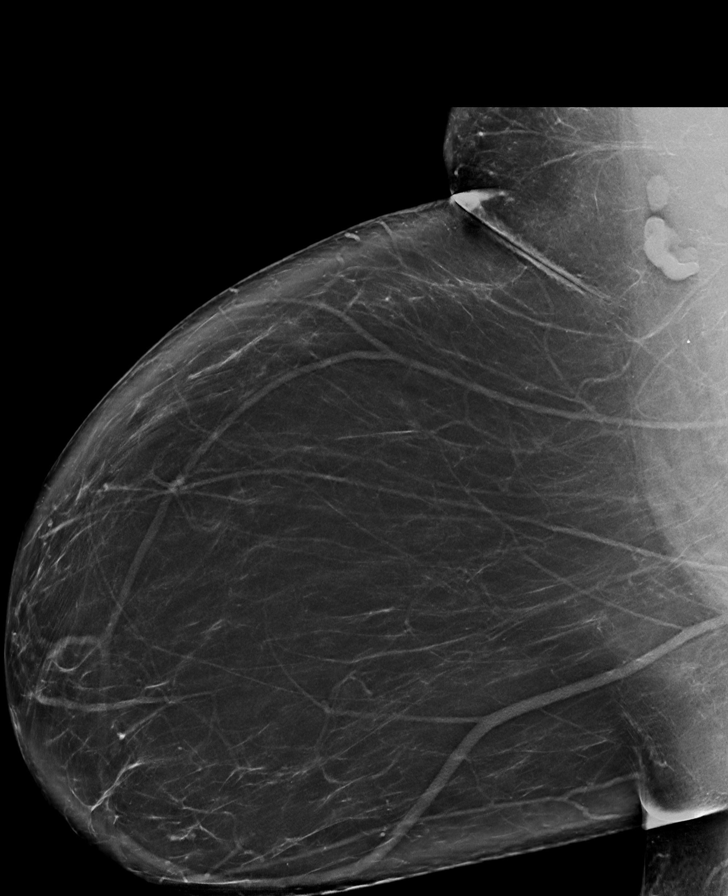

[R CC synth-2D]
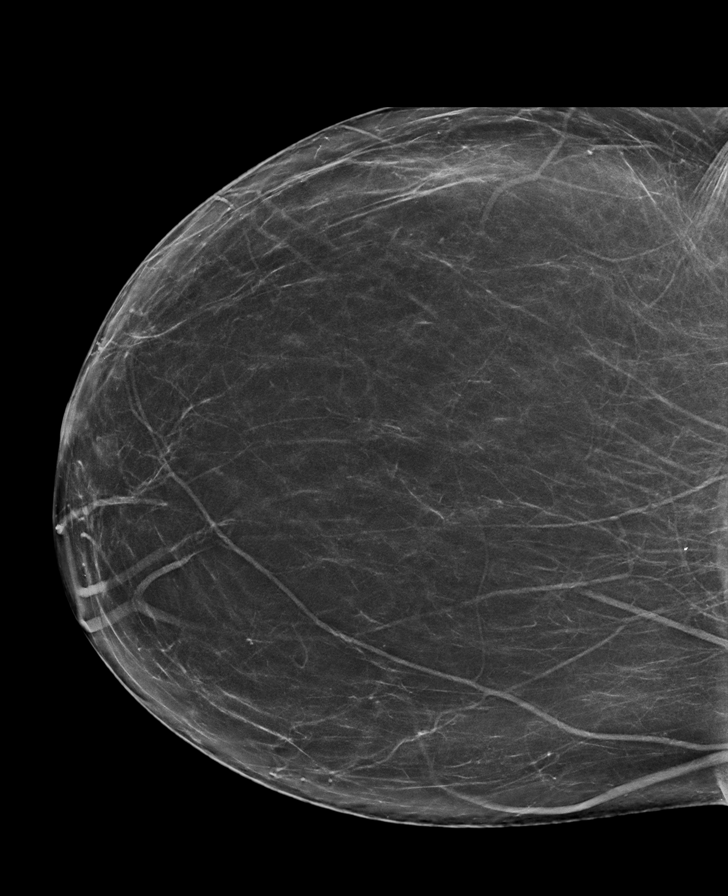

[L CC synth-2D (1 of 2)]
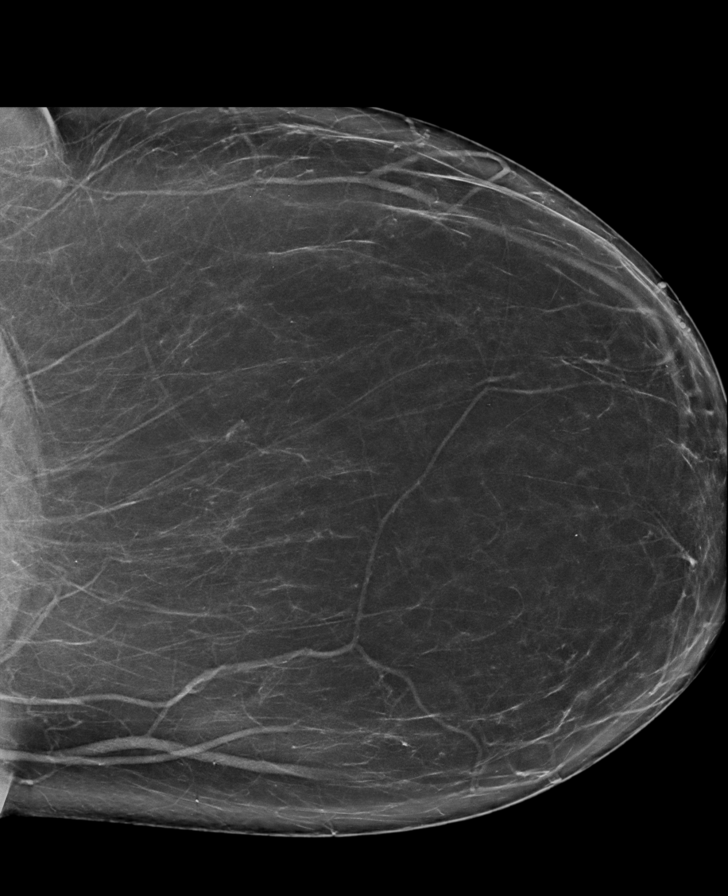

[L CC synth-2D (2 of 2)]
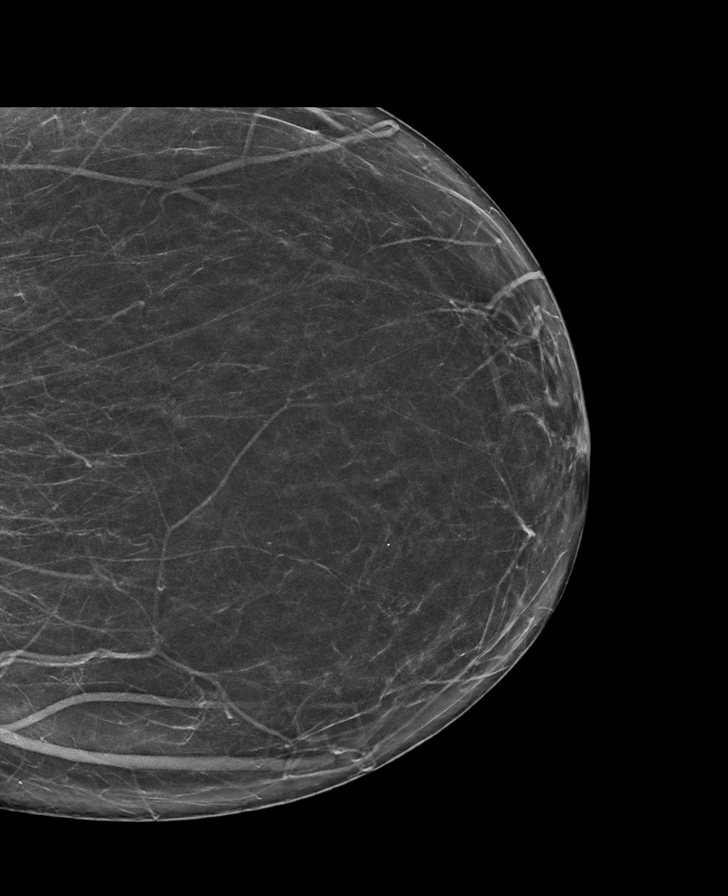

[L MLO synth-2D (1 of 2)]
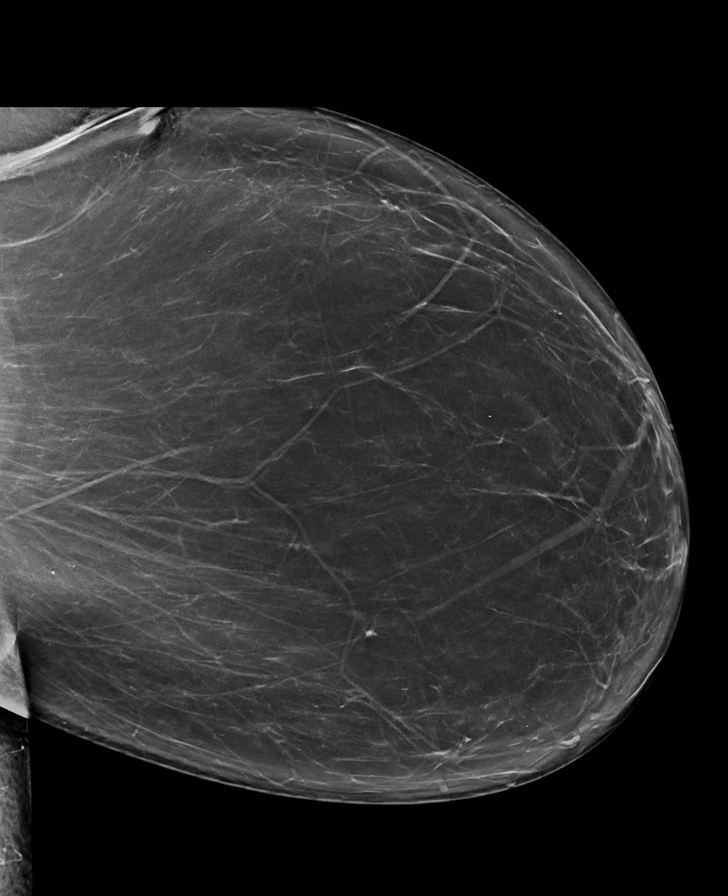

[L MLO synth-2D (2 of 2)]
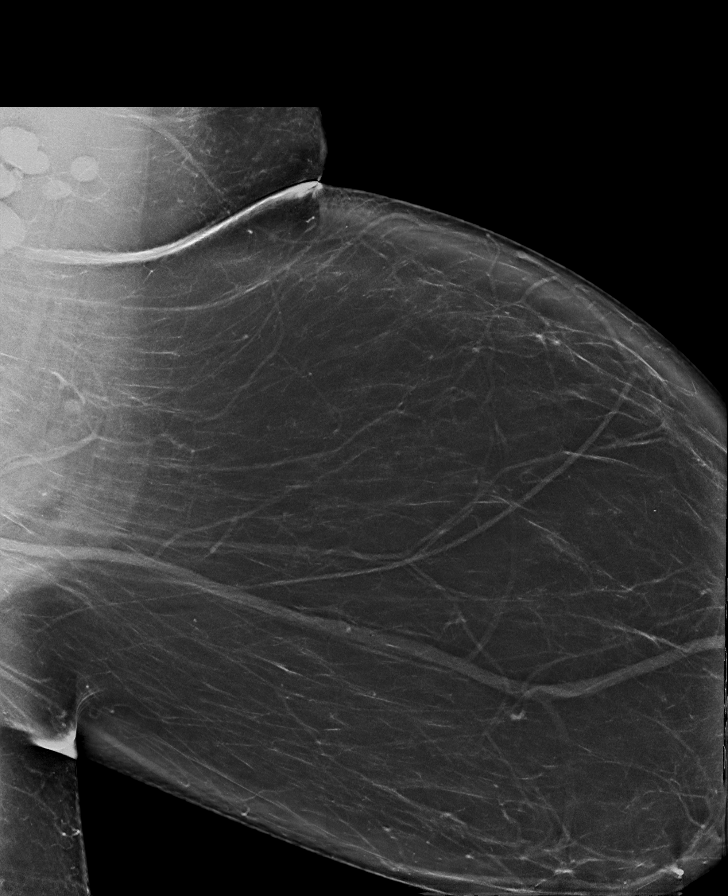

[R MLO synth-2D (2 of 2)]
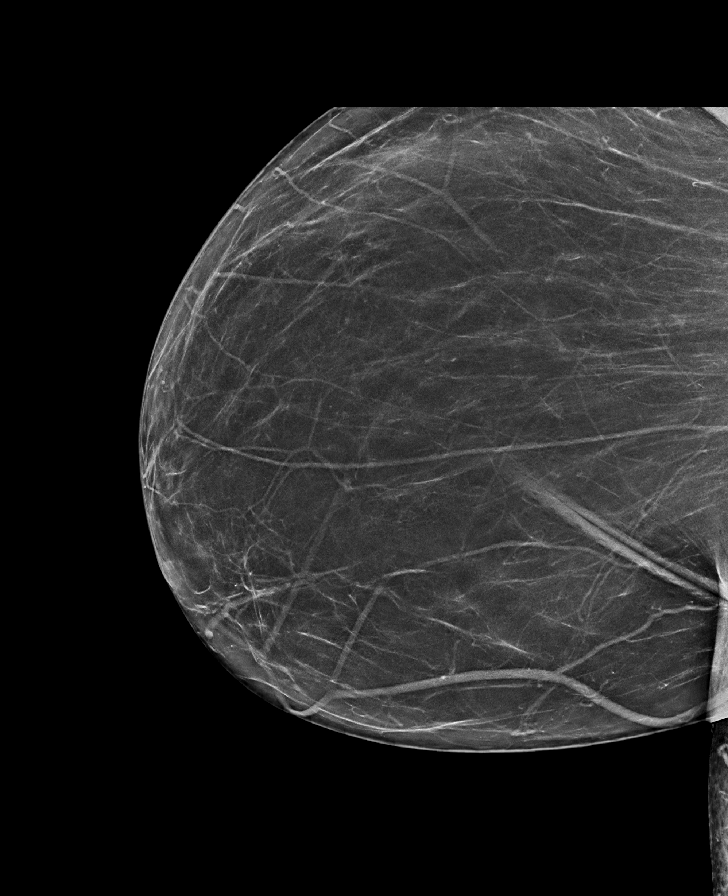

[L CC tomo · tomo slice 37/72.0]
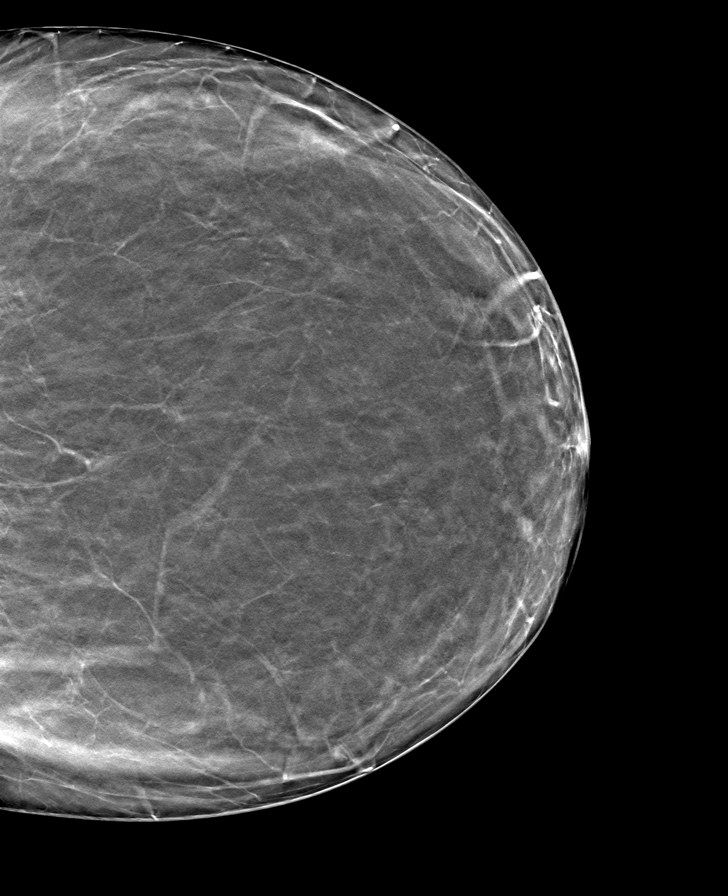

[8 of 40 positions shown; findings below may reference images not displayed]

ACR Breast Density Category b: There are scattered areas of
fibroglandular density.
FINDINGS: There are no findings suspicious for malignancy.
IMPRESSION: No mammographic evidence of malignancy. A result letter of this
screening mammogram will be mailed directly to the patient.

RECOMMENDATION:
Screening mammogram in one year. (Code:51-O-LD2)

BI-RADS CATEGORY  1: Negative.

## 2023-05-07 ENCOUNTER — Encounter: Payer: Self-pay | Admitting: Nurse Practitioner

## 2023-05-07 ENCOUNTER — Ambulatory Visit: Payer: Managed Care, Other (non HMO) | Admitting: Nurse Practitioner

## 2023-05-07 VITALS — BP 100/70 | HR 87 | Temp 98.6°F | Ht 64.0 in | Wt 235.6 lb

## 2023-05-07 DIAGNOSIS — E559 Vitamin D deficiency, unspecified: Secondary | ICD-10-CM | POA: Diagnosis not present

## 2023-05-07 DIAGNOSIS — E782 Mixed hyperlipidemia: Secondary | ICD-10-CM | POA: Diagnosis not present

## 2023-05-07 DIAGNOSIS — E66813 Obesity, class 3: Secondary | ICD-10-CM

## 2023-05-07 DIAGNOSIS — Z2821 Immunization not carried out because of patient refusal: Secondary | ICD-10-CM

## 2023-05-07 DIAGNOSIS — F3181 Bipolar II disorder: Secondary | ICD-10-CM

## 2023-05-07 DIAGNOSIS — Z79899 Other long term (current) drug therapy: Secondary | ICD-10-CM

## 2023-05-07 DIAGNOSIS — Z0000 Encounter for general adult medical examination without abnormal findings: Secondary | ICD-10-CM

## 2023-05-07 DIAGNOSIS — Z23 Encounter for immunization: Secondary | ICD-10-CM

## 2023-05-07 DIAGNOSIS — Z6841 Body Mass Index (BMI) 40.0 and over, adult: Secondary | ICD-10-CM

## 2023-05-07 NOTE — Progress Notes (Signed)
Madelaine Bhat, CMA,acting as a Neurosurgeon for Arnette Felts, FNP.,have documented all relevant documentation on the behalf of Arnette Felts, FNP,as directed by  Arnette Felts, FNP while in the presence of Arnette Felts, FNP.  Subjective:    Patient ID: Carmen Kim , female    DOB: 1965/08/11 , 57 y.o.   MRN: [id number]  Chief Complaint  Patient presents with   Annual Exam    HPI  Patient presents today for HM, Patient reports compliance with medication. Patient denies any chest pain, SOB, or headaches. Patient has no concerns today. She has been seeing the kidney functions and she was borderline and they did not find any issues. She continues to see the psychiatrist to manage her Bipolar. She has had some medication changes recently. Her psychiatrist feels she is medication resistant. When she had counseling it did not seem to help.   She is taking NAC supplement guided by the psychiatrist. She has her lithium level checked - she will get a copy of her results faxed to the office.     Past Medical History:  Diagnosis Date   Allergy    Anemia    Bipolar affect, depressed ([facility])    Chronic kidney disease    mild being monitored   Depressed    Hemorrhoids    IBS (irritable bowel syndrome)    Seizures ([facility]) 2008   from Wellbutrin only     Family History  Problem Relation Age of Onset   Diabetes Mother    Hypertension Mother    Cerebral aneurysm Mother    Anxiety disorder Mother    Depression Mother    Early death Mother    Obesity Mother    Stroke Mother    Colon cancer Maternal Grandmother    Cancer Maternal Grandmother    Diabetes Maternal Grandmother    Alcohol abuse Maternal Grandfather    Arthritis Maternal Grandfather    Cancer Maternal Grandfather    Depression Maternal Grandfather    Hypertension Maternal Grandfather    ADD / ADHD Maternal Uncle    Anxiety disorder Maternal Uncle    Anxiety disorder Maternal Aunt    Arthritis Maternal Aunt    Depression Maternal  Aunt    Diabetes Maternal Aunt    Hypertension Maternal Aunt    Obesity Maternal Aunt    Drug abuse Maternal Uncle    Early death Maternal Uncle    Hypertension Maternal Uncle    Esophageal cancer Neg Hx    Rectal cancer Neg Hx    Stomach cancer Neg Hx    Colon polyps Neg Hx      Current Outpatient Medications:    aspirin-acetaminophen-caffeine (EXCEDRIN MIGRAINE) 250-250-65 MG tablet, Take by mouth every 6 (six) hours as needed for headache., Disp: , Rfl:    busPIRone (BUSPAR) 10 MG tablet, Take 10 mg by mouth 2 (two) times daily., Disp: , Rfl:    lamoTRIgine (LAMICTAL) 150 MG tablet, Take by mouth., Disp: , Rfl:    lithium 300 MG tablet, Take 600 mg by mouth at bedtime., Disp: , Rfl:    Vitamin D, Ergocalciferol, (DRISDOL) 1.25 MG (50000 UNIT) CAPS capsule, Take 1 capsule (50,000 Units total) by mouth every 7 (seven) days., Disp: 12 capsule, Rfl: 1   Allergies  Allergen Reactions   Wellbutrin [Bupropion] Other (See Comments)    seizures      The patient states she uses post menopausal status for birth control. Patient's last menstrual period was 04/19/2014 (exact date).. Negative  for Dysmenorrhea and Negative for Menorrhagia. Negative for: breast discharge, breast lump(s), breast pain and breast self exam. Associated symptoms include abnormal vaginal bleeding. Pertinent negatives include abnormal bleeding (hematology), anxiety, decreased libido, depression, difficulty falling sleep, dyspareunia, history of infertility, nocturia, sexual dysfunction, sleep disturbances, urinary incontinence, urinary urgency, vaginal discharge and vaginal itching. Diet regular. The patient states her exercise level is none, she does not have any barriers. She admits she is still depressed.   The patient's tobacco use is:  Social History   Tobacco Use  Smoking Status Never  Smokeless Tobacco Never  . She has been exposed to passive smoke. The patient's alcohol use is:  Social History   Substance  and Sexual Activity  Alcohol Use Yes   Alcohol/week: 1.0 standard drink of alcohol   Types: 1 Glasses of wine per week   Comment: Rare   Additional information: Last pap 04/20/2022, next one scheduled for 04/20/2025.    Review of Systems  Constitutional: Negative.   HENT: Negative.    Eyes: Negative.   Respiratory: Negative.    Cardiovascular: Negative.   Gastrointestinal: Negative.   Endocrine: Negative.   Genitourinary: Negative.   Musculoskeletal: Negative.   Skin: Negative.   Allergic/Immunologic: Negative.   Neurological: Negative.   Hematological: Negative.   Psychiatric/Behavioral:  Negative for suicidal ideas. The patient is not nervous/anxious.      Today's Vitals   05/07/23 0849  BP: 100/70  Pulse: 87  Temp: 98.6 F (37 C)  TempSrc: Oral  Weight: 235 lb 9.6 oz (106.9 kg)  Height: 5\' 4"  (1.626 m)  PainSc: 0-No pain   Body mass index is 40.44 kg/m.  Wt Readings from Last 3 Encounters:  05/07/23 235 lb 9.6 oz (106.9 kg)  04/20/22 230 lb (104.3 kg)  03/20/22 220 lb (99.8 kg)       05/07/2023    8:53 AM 04/20/2022    8:48 AM 12/24/2019    2:51 PM 12/17/2018    2:18 PM 08/05/2018   11:14 AM  Depression screen PHQ 2/9  Decreased Interest 3 3 0 0 3  Down, Depressed, Hopeless 3 3 0 0 2  PHQ - 2 Score 6 6 0 0 5  Altered sleeping 3 3   2   Tired, decreased energy 3 3   3   Change in appetite 2 3   3   Feeling bad or failure about yourself  3 3   3   Trouble concentrating 2 1   1   Moving slowly or fidgety/restless 0 0   0  Suicidal thoughts 2 1   0  PHQ-9 Score 21 20   17   Difficult doing work/chores Extremely dIfficult Extremely dIfficult         05/07/2023    8:53 AM  GAD 7 : Generalized Anxiety Score  Nervous, Anxious, on Edge 2  Control/stop worrying 3  Worry too much - different things 3  Trouble relaxing 3  Restless 0  Easily annoyed or irritable 3  Afraid - awful might happen 0  Total GAD 7 Score 14  Anxiety Difficulty Extremely difficult       Objective:  Physical Exam Vitals reviewed. Exam conducted with a chaperone present.  Constitutional:      General: She is not in acute distress.    Appearance: Normal appearance. She is well-developed. She is obese.  HENT:     Head: Normocephalic and atraumatic.     Right Ear: Hearing, tympanic membrane, ear canal and external ear normal. There is  no impacted cerumen.     Left Ear: Hearing, tympanic membrane, ear canal and external ear normal. There is no impacted cerumen.     Nose:     Comments: Deferred - masked    Mouth/Throat:     Comments: Deferred - masked Eyes:     General: Lids are normal.     Extraocular Movements: Extraocular movements intact.     Conjunctiva/sclera: Conjunctivae normal.     Pupils: Pupils are equal, round, and reactive to light.     Funduscopic exam:    Right eye: No papilledema.        Left eye: No papilledema.  Neck:     Thyroid: No thyroid mass.     Vascular: No carotid bruit.  Cardiovascular:     Rate and Rhythm: Normal rate and regular rhythm.     Pulses: Normal pulses.     Heart sounds: Normal heart sounds. No murmur heard. Pulmonary:     Effort: Pulmonary effort is normal. No respiratory distress.     Breath sounds: Normal breath sounds. No wheezing.  Chest:     Chest wall: No mass.  Breasts:    Tanner Score is 5.     Right: Normal. No mass or tenderness.     Left: Normal. No mass or tenderness.  Abdominal:     General: Abdomen is flat. Bowel sounds are normal. There is no distension.     Palpations: Abdomen is soft.     Tenderness: There is no abdominal tenderness.     Hernia: There is no hernia in the right inguinal area.  Genitourinary:    Exam position: Lithotomy position.     Tanner stage (genital): 5.     Labia:        Right: No rash.        Left: No rash.      Vagina: Normal.     Uterus: Normal.      Adnexa: Right adnexa normal and left adnexa normal.     Comments: Cervix pointed posteriorly Musculoskeletal:         General: No swelling or tenderness. Normal range of motion.     Cervical back: Full passive range of motion without pain, normal range of motion and neck supple.     Right lower leg: No edema.     Left lower leg: No edema.  Lymphadenopathy:     Upper Body:     Right upper body: No supraclavicular, axillary or pectoral adenopathy.     Left upper body: No supraclavicular, axillary or pectoral adenopathy.  Skin:    General: Skin is warm and dry.     Capillary Refill: Capillary refill takes less than 2 seconds.  Neurological:     General: No focal deficit present.     Mental Status: She is alert and oriented to person, place, and time.     Cranial Nerves: No cranial nerve deficit.     Sensory: No sensory deficit.     Motor: No weakness.     Comments: Negative phalen and tinel  Psychiatric:        Mood and Affect: Mood normal.        Behavior: Behavior normal.        Thought Content: Thought content normal.        Judgment: Judgment normal.         Assessment And Plan:     Encounter for annual health examination Assessment & Plan: Behavior modifications discussed and diet history  reviewed.   Pt will continue to exercise regularly and modify diet with low GI, plant based foods and decrease intake of processed foods.  Recommend intake of daily multivitamin, Vitamin D, and calcium.  Recommend mammogram and colonoscopy for preventive screenings, as well as recommend immunizations that include influenza, TDAP, and Shingles    Bipolar 2 disorder ([facility]) Assessment & Plan: Continue f/u with Behavioral health specialist   Mixed hyperlipidemia Assessment & Plan: Cholesterol levels are stable. Continue focusing on low fat diet  Orders: -     CMP14+EGFR -     Lipid panel  Vitamin D deficiency Assessment & Plan: Will check vitamin D level and supplement as needed.    Also encouraged to spend 15 minutes in the sun daily.    Orders: -     VITAMIN D 25 Hydroxy (Vit-D  Deficiency, Fractures)  Need for Tdap vaccination Assessment & Plan: Will give tetanus vaccine today while in office. Refer to order management. TDAP will be administered to adults 27-71 years old every 10 years.   Orders: -     Tdap vaccine greater than or equal to 7yo IM  Herpes zoster vaccination declined Assessment & Plan: Declines shingrix, educated on disease process and is aware if he changes his mind to notify office    Class 3 severe obesity due to excess calories with body mass index (BMI) of 40.0 to 44.9 in adult, unspecified whether serious comorbidity present [facility]) Assessment & Plan: She is encouraged to strive for BMI less than 30 to decrease cardiac risk. Advised to aim for at least 150 minutes of exercise per week. She feels like her depression is causing her problems with getting the motivation to exercise.   Orders: -     Hemoglobin A1c  Other long term (current) drug therapy -     CBC with Differential/Platelet    Return in 6 months (on 11/05/2023) for 6 MONTHS and 1 year physical. Patient was given opportunity to ask questions. Patient verbalized understanding of the plan and was able to repeat key elements of the plan. All questions were answered to their satisfaction.   Arnette Felts, FNP  I, Arnette Felts, FNP, have reviewed all documentation for this visit. The documentation on 05/07/23 for the exam, diagnosis, procedures, and orders are all accurate and complete.

## 2023-05-08 ENCOUNTER — Other Ambulatory Visit: Payer: Self-pay | Admitting: Nurse Practitioner

## 2023-05-08 DIAGNOSIS — E559 Vitamin D deficiency, unspecified: Secondary | ICD-10-CM

## 2023-05-08 LAB — CMP14+EGFR
ALT: 39 [IU]/L — ABNORMAL HIGH (ref 0–32)
AST: 35 [IU]/L (ref 0–40)
Albumin: 4.6 g/dL (ref 3.8–4.9)
Alkaline Phosphatase: 123 [IU]/L — ABNORMAL HIGH (ref 44–121)
BUN/Creatinine Ratio: 17 (ref 9–23)
BUN: 19 mg/dL (ref 6–24)
Bilirubin Total: 0.7 mg/dL (ref 0.0–1.2)
CO2: 23 mmol/L (ref 20–29)
Calcium: 10.4 mg/dL — ABNORMAL HIGH (ref 8.7–10.2)
Chloride: 102 mmol/L (ref 96–106)
Creatinine, Ser: 1.13 mg/dL — ABNORMAL HIGH (ref 0.57–1.00)
Globulin, Total: 2.9 g/dL (ref 1.5–4.5)
Glucose: 84 mg/dL (ref 70–99)
Potassium: 4.8 mmol/L (ref 3.5–5.2)
Sodium: 140 mmol/L (ref 134–144)
Total Protein: 7.5 g/dL (ref 6.0–8.5)
eGFR: 57 mL/min/{1.73_m2} — ABNORMAL LOW (ref >59)

## 2023-05-08 LAB — CBC WITH DIFFERENTIAL/PLATELET
Basophils Absolute: 0 10*3/uL (ref 0.0–0.2)
Basos: 1 %
EOS (ABSOLUTE): 0.1 10*3/uL (ref 0.0–0.4)
Eos: 2 %
Hematocrit: 42.1 % (ref 34.0–46.6)
Hemoglobin: 13 g/dL (ref 11.1–15.9)
Immature Grans (Abs): 0 10*3/uL (ref 0.0–0.1)
Immature Granulocytes: 0 %
Lymphocytes Absolute: 1.8 10*3/uL (ref 0.7–3.1)
Lymphs: 28 %
MCH: 26.2 pg — ABNORMAL LOW (ref 26.6–33.0)
MCHC: 30.9 g/dL — ABNORMAL LOW (ref 31.5–35.7)
MCV: 85 fL (ref 79–97)
Monocytes Absolute: 0.4 10*3/uL (ref 0.1–0.9)
Monocytes: 7 %
Neutrophils Absolute: 4 10*3/uL (ref 1.4–7.0)
Neutrophils: 62 %
Platelets: 305 10*3/uL (ref 150–450)
RBC: 4.97 x10E6/uL (ref 3.77–5.28)
RDW: 13 % (ref 11.7–15.4)
WBC: 6.4 10*3/uL (ref 3.4–10.8)

## 2023-05-08 LAB — LIPID PANEL
Chol/HDL Ratio: 2.4 {ratio} (ref 0.0–4.4)
Cholesterol, Total: 213 mg/dL — ABNORMAL HIGH (ref 100–199)
HDL: 87 mg/dL (ref >39)
LDL Chol Calc (NIH): 112 mg/dL — ABNORMAL HIGH (ref 0–99)
Triglycerides: 82 mg/dL (ref 0–149)
VLDL Cholesterol Cal: 14 mg/dL (ref 5–40)

## 2023-05-08 LAB — HEMOGLOBIN A1C
Est. average glucose Bld gHb Est-mCnc: 105 mg/dL
Hgb A1c MFr Bld: 5.3 % (ref 4.8–5.6)

## 2023-05-08 LAB — VITAMIN D 25 HYDROXY (VIT D DEFICIENCY, FRACTURES): Vit D, 25-Hydroxy: 18.4 ng/mL — ABNORMAL LOW (ref 30.0–100.0)

## 2023-05-08 MED ORDER — VITAMIN D (ERGOCALCIFEROL) 1.25 MG (50000 UNIT) PO CAPS: 50000.0000 [IU] | ORAL_CAPSULE | ORAL | 1 refills | Status: AC

## 2023-05-15 DIAGNOSIS — E66813 Obesity, class 3: Secondary | ICD-10-CM | POA: Insufficient documentation

## 2023-05-15 DIAGNOSIS — E782 Mixed hyperlipidemia: Secondary | ICD-10-CM | POA: Insufficient documentation

## 2023-05-15 DIAGNOSIS — Z0000 Encounter for general adult medical examination without abnormal findings: Secondary | ICD-10-CM | POA: Insufficient documentation

## 2023-05-15 DIAGNOSIS — Z2821 Immunization not carried out because of patient refusal: Secondary | ICD-10-CM | POA: Insufficient documentation

## 2023-05-15 DIAGNOSIS — Z23 Encounter for immunization: Secondary | ICD-10-CM | POA: Insufficient documentation

## 2023-05-15 NOTE — Assessment & Plan Note (Signed)
She is encouraged to strive for BMI less than 30 to decrease cardiac risk. Advised to aim for at least 150 minutes of exercise per week. She feels like her depression is causing her problems with getting the motivation to exercise.

## 2023-05-15 NOTE — Assessment & Plan Note (Signed)
Will check vitamin D level and supplement as needed.    Also encouraged to spend 15 minutes in the sun daily.   

## 2023-05-15 NOTE — Assessment & Plan Note (Signed)

## 2023-05-15 NOTE — Assessment & Plan Note (Signed)
Declines shingrix, educated on disease process and is aware if he changes his mind to notify office  

## 2023-05-15 NOTE — Assessment & Plan Note (Signed)
Will give tetanus vaccine today while in office. Refer to order management. TDAP will be administered to adults 79-57 years old every 10 years.

## 2023-05-15 NOTE — Assessment & Plan Note (Signed)
 Cholesterol levels are stable.  Continue focusing on low-fat diet.

## 2023-05-15 NOTE — Assessment & Plan Note (Addendum)
Continue f/u with Behavioral health specialist. Depression screen score 21

## 2023-06-01 ENCOUNTER — Other Ambulatory Visit: Payer: Self-pay | Admitting: Nurse Practitioner

## 2023-06-01 DIAGNOSIS — Z1231 Encounter for screening mammogram for malignant neoplasm of breast: Secondary | ICD-10-CM

## 2023-06-12 ENCOUNTER — Ambulatory Visit
Admission: RE | Admit: 2023-06-12 | Discharge: 2023-06-12 | Disposition: A | Discharge status: Home / Self Care | Payer: Managed Care, Other (non HMO) | Source: Ambulatory Visit | Attending: Nurse Practitioner | Admitting: Nurse Practitioner

## 2023-06-12 DIAGNOSIS — Z1231 Encounter for screening mammogram for malignant neoplasm of breast: Secondary | ICD-10-CM

## 2023-09-02 IMAGING — CR DG CHEST 2V
1 series · 2 of 2 positions shown · non-contrast
Comparison: None.

CLINICAL DATA: 56-year-old female with a history of anesthesia

EXAM:
CHEST - 2 VIEW

[Series 1: w chest pa · 0.14mm/px · 2 of 2 slices shown]
[im 1/2]
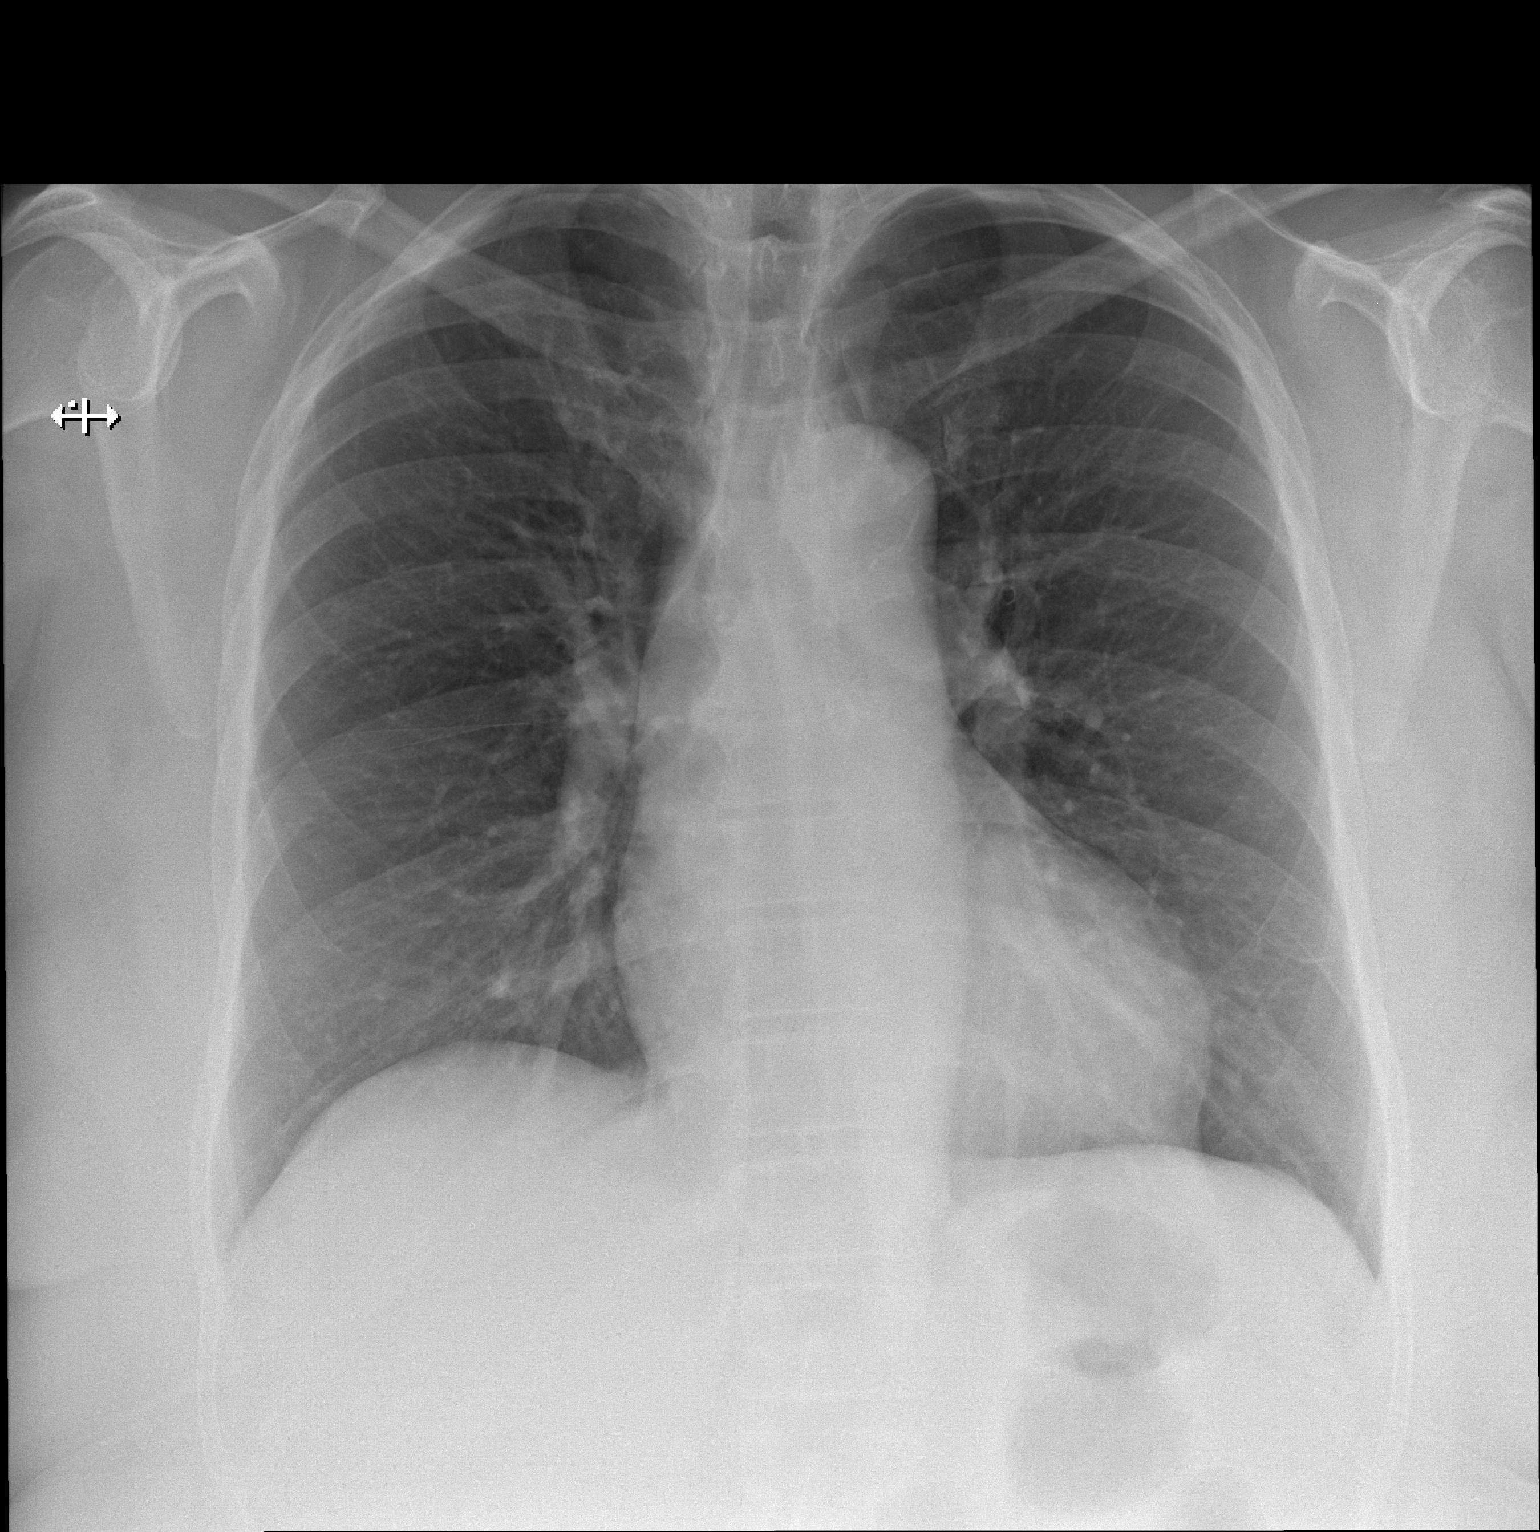
[im 2/2]
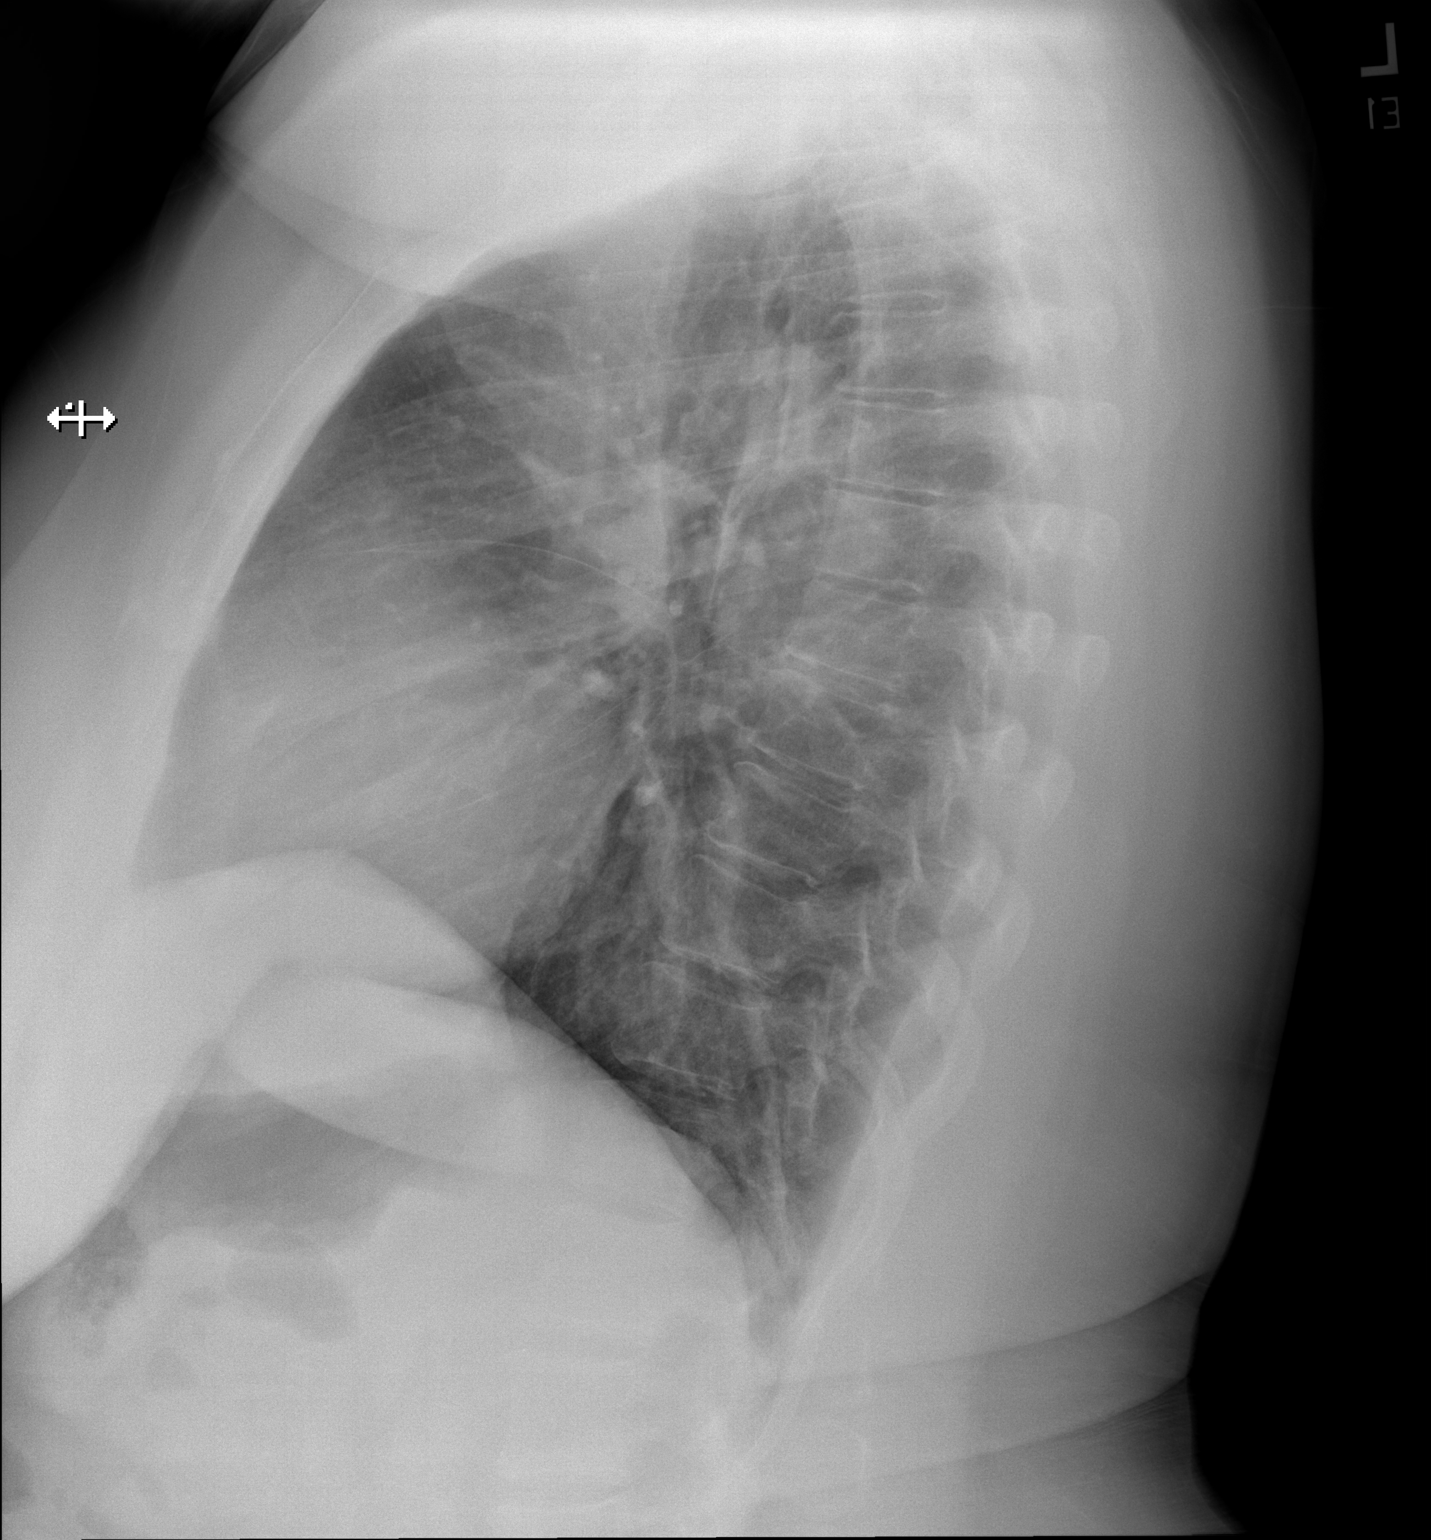

[2 of 2 positions shown; findings below may reference images not displayed]

FINDINGS: Cardiomediastinal silhouette within normal limits in size and
contour. No evidence of central vascular congestion. No interlobular
septal thickening.

No pneumothorax or pleural effusion. Coarsened interstitial
markings, with no confluent airspace disease.

No acute displaced fracture. Degenerative changes of the spine.
IMPRESSION: No active cardiopulmonary disease.

## 2023-11-05 ENCOUNTER — Ambulatory Visit: Payer: Managed Care, Other (non HMO) | Admitting: Nurse Practitioner

## 2024-05-08 ENCOUNTER — Encounter: Payer: Self-pay | Admitting: Nurse Practitioner

## 2024-05-08 NOTE — Progress Notes (Deleted)
 LILLETTE Kristeen JINNY Gladis, CMA,acting as a neurosurgeon for Gaines Ada, FNP.,have documented all relevant documentation on the behalf of Gaines Ada, FNP,as directed by  Gaines Ada, FNP while in the presence of Gaines Ada, FNP.  Subjective:    Patient ID: Carmen Kim , female    DOB: [birth year] , 58 y.o.   MRN: [id number]  No chief complaint on file.   HPI  HPI   Past Medical History:  Diagnosis Date   Allergy    Anemia    Bipolar affect, depressed (HCC)    Chronic kidney disease    mild being monitored   Depressed    Hemorrhoids    IBS (irritable bowel syndrome)    Seizures (HCC) 2008   from Wellbutrin only     Family History  Problem Relation Age of Onset   Diabetes Mother    Hypertension Mother    Cerebral aneurysm Mother    Anxiety disorder Mother    Depression Mother    Early death Mother    Obesity Mother    Stroke Mother    Colon cancer Maternal Grandmother    Cancer Maternal Grandmother    Diabetes Maternal Grandmother    Alcohol abuse Maternal Grandfather    Arthritis Maternal Grandfather    Cancer Maternal Grandfather    Depression Maternal Grandfather    Hypertension Maternal Grandfather    ADD / ADHD Maternal Uncle    Anxiety disorder Maternal Uncle    Anxiety disorder Maternal Aunt    Arthritis Maternal Aunt    Depression Maternal Aunt    Diabetes Maternal Aunt    Hypertension Maternal Aunt    Obesity Maternal Aunt    Drug abuse Maternal Uncle    Early death Maternal Uncle    Hypertension Maternal Uncle    Esophageal cancer Neg Hx    Rectal cancer Neg Hx    Stomach cancer Neg Hx    Colon polyps Neg Hx      Current Outpatient Medications:    aspirin-acetaminophen -caffeine (EXCEDRIN MIGRAINE) 250-250-65 MG tablet, Take by mouth every 6 (six) hours as needed for headache., Disp: , Rfl:    busPIRone (BUSPAR) 10 MG tablet, Take 10 mg by mouth 2 (two) times daily., Disp: , Rfl:    lamoTRIgine  (LAMICTAL ) 150 MG tablet, Take by mouth., Disp: , Rfl:     lithium  300 MG tablet, Take 600 mg by mouth at bedtime., Disp: , Rfl:    Vitamin D , Ergocalciferol , (DRISDOL ) 1.25 MG (50000 UNIT) CAPS capsule, Take 1 capsule (50,000 Units total) by mouth every 7 (seven) days., Disp: 12 capsule, Rfl: 1   Allergies  Allergen Reactions   Wellbutrin [Bupropion] Other (See Comments)    seizures      The patient states she uses {contraceptive methods:5051} for birth control. Patient's last menstrual period was 04/19/2014 (exact date).. {Dysmenorrhea-menorrhagia:21918}. Negative for: breast discharge, breast lump(s), breast pain and breast self exam. Associated symptoms include abnormal vaginal bleeding. Pertinent negatives include abnormal bleeding (hematology), anxiety, decreased libido, depression, difficulty falling sleep, dyspareunia, history of infertility, nocturia, sexual dysfunction, sleep disturbances, urinary incontinence, urinary urgency, vaginal discharge and vaginal itching. Diet regular.The patient states her exercise level is    . The patient's tobacco use is:  Social History   Tobacco Use  Smoking Status Never  Smokeless Tobacco Never  . She has been exposed to passive smoke. The patient's alcohol use is:  Social History   Substance and Sexual Activity  Alcohol Use Yes   Alcohol/week: 1.0 standard  drink of alcohol   Types: 1 Glasses of wine per week   Comment: Rare  . Additional information: Last pap ***, next one scheduled for ***.    Review of Systems   There were no vitals filed for this visit. There is no height or weight on file to calculate BMI.  Wt Readings from Last 3 Encounters:  05/07/23 235 lb 9.6 oz (106.9 kg)  04/20/22 230 lb (104.3 kg)  03/20/22 220 lb (99.8 kg)     Objective:  Physical Exam      Assessment And Plan:     Encounter for annual health examination  Bipolar 2 disorder (HCC)  Mixed hyperlipidemia  Vitamin D  deficiency     No follow-ups on file. Patient was given opportunity to ask questions.  Patient verbalized understanding of the plan and was able to repeat key elements of the plan. All questions were answered to their satisfaction.   Gaines Ada, FNP  I, Gaines Ada, FNP, have reviewed all documentation for this visit. The documentation on 05/08/24 for the exam, diagnosis, procedures, and orders are all accurate and complete.
